# Patient Record
Sex: Female | Born: 1945
Health system: Southern US, Community
[De-identification: ages and names within clinical notes are randomized; demographics above are authoritative.]

## PROBLEM LIST (undated history)

## (undated) DIAGNOSIS — I10 Essential (primary) hypertension: Secondary | ICD-10-CM

## (undated) DIAGNOSIS — H409 Unspecified glaucoma: Secondary | ICD-10-CM

## (undated) DIAGNOSIS — L509 Urticaria, unspecified: Secondary | ICD-10-CM

## (undated) DIAGNOSIS — J45909 Unspecified asthma, uncomplicated: Secondary | ICD-10-CM

## (undated) DIAGNOSIS — L309 Dermatitis, unspecified: Secondary | ICD-10-CM

## (undated) DIAGNOSIS — E785 Hyperlipidemia, unspecified: Secondary | ICD-10-CM

## (undated) DIAGNOSIS — T783XXA Angioneurotic edema, initial encounter: Secondary | ICD-10-CM

## (undated) HISTORY — DX: Unspecified asthma, uncomplicated: J45.909

## (undated) HISTORY — DX: Hyperlipidemia, unspecified: E78.5

## (undated) HISTORY — DX: Angioneurotic edema, initial encounter: T78.3XXA

## (undated) HISTORY — DX: Urticaria, unspecified: L50.9

## (undated) HISTORY — DX: Unspecified glaucoma: H40.9

## (undated) HISTORY — DX: Essential (primary) hypertension: I10

## (undated) HISTORY — DX: Dermatitis, unspecified: L30.9

## (undated) HISTORY — PX: TONSILLECTOMY: SUR1361

---
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
## 2012-10-18 HISTORY — PX: COLONOSCOPY: SHX174

## 2012-11-10 ENCOUNTER — Encounter: Payer: Self-pay | Admitting: Gastroenterology

## 2013-04-03 DIAGNOSIS — H409 Unspecified glaucoma: Secondary | ICD-10-CM | POA: Diagnosis not present

## 2013-04-04 DIAGNOSIS — H538 Other visual disturbances: Secondary | ICD-10-CM | POA: Diagnosis not present

## 2013-04-04 DIAGNOSIS — K219 Gastro-esophageal reflux disease without esophagitis: Secondary | ICD-10-CM | POA: Diagnosis not present

## 2013-04-04 DIAGNOSIS — Z79899 Other long term (current) drug therapy: Secondary | ICD-10-CM | POA: Diagnosis not present

## 2013-04-04 DIAGNOSIS — I10 Essential (primary) hypertension: Secondary | ICD-10-CM | POA: Diagnosis not present

## 2013-04-04 DIAGNOSIS — R51 Headache: Secondary | ICD-10-CM | POA: Diagnosis not present

## 2013-04-04 DIAGNOSIS — R5381 Other malaise: Secondary | ICD-10-CM | POA: Diagnosis not present

## 2013-04-04 DIAGNOSIS — E7800 Pure hypercholesterolemia, unspecified: Secondary | ICD-10-CM | POA: Diagnosis not present

## 2013-04-04 DIAGNOSIS — R0602 Shortness of breath: Secondary | ICD-10-CM | POA: Diagnosis not present

## 2013-04-04 DIAGNOSIS — G459 Transient cerebral ischemic attack, unspecified: Secondary | ICD-10-CM | POA: Diagnosis not present

## 2013-04-04 DIAGNOSIS — R079 Chest pain, unspecified: Secondary | ICD-10-CM | POA: Diagnosis not present

## 2013-04-06 DIAGNOSIS — M069 Rheumatoid arthritis, unspecified: Secondary | ICD-10-CM | POA: Diagnosis not present

## 2013-04-06 DIAGNOSIS — I10 Essential (primary) hypertension: Secondary | ICD-10-CM | POA: Diagnosis not present

## 2013-05-04 DIAGNOSIS — I10 Essential (primary) hypertension: Secondary | ICD-10-CM | POA: Diagnosis not present

## 2013-05-04 DIAGNOSIS — H409 Unspecified glaucoma: Secondary | ICD-10-CM | POA: Diagnosis not present

## 2013-08-04 DIAGNOSIS — I10 Essential (primary) hypertension: Secondary | ICD-10-CM | POA: Diagnosis not present

## 2013-08-04 DIAGNOSIS — E7800 Pure hypercholesterolemia, unspecified: Secondary | ICD-10-CM | POA: Diagnosis not present

## 2013-08-04 DIAGNOSIS — M159 Polyosteoarthritis, unspecified: Secondary | ICD-10-CM | POA: Diagnosis not present

## 2013-08-04 DIAGNOSIS — Z79899 Other long term (current) drug therapy: Secondary | ICD-10-CM | POA: Diagnosis not present

## 2013-08-04 DIAGNOSIS — J019 Acute sinusitis, unspecified: Secondary | ICD-10-CM | POA: Diagnosis not present

## 2013-08-11 DIAGNOSIS — G43109 Migraine with aura, not intractable, without status migrainosus: Secondary | ICD-10-CM | POA: Diagnosis not present

## 2013-08-22 DIAGNOSIS — M4310 Spondylolisthesis, site unspecified: Secondary | ICD-10-CM | POA: Diagnosis not present

## 2013-08-24 DIAGNOSIS — M999 Biomechanical lesion, unspecified: Secondary | ICD-10-CM | POA: Diagnosis not present

## 2013-08-24 DIAGNOSIS — M9981 Other biomechanical lesions of cervical region: Secondary | ICD-10-CM | POA: Diagnosis not present

## 2013-08-24 DIAGNOSIS — M5137 Other intervertebral disc degeneration, lumbosacral region: Secondary | ICD-10-CM | POA: Diagnosis not present

## 2013-08-24 DIAGNOSIS — IMO0002 Reserved for concepts with insufficient information to code with codable children: Secondary | ICD-10-CM | POA: Diagnosis not present

## 2013-08-25 DIAGNOSIS — Z1231 Encounter for screening mammogram for malignant neoplasm of breast: Secondary | ICD-10-CM | POA: Diagnosis not present

## 2013-08-25 DIAGNOSIS — R928 Other abnormal and inconclusive findings on diagnostic imaging of breast: Secondary | ICD-10-CM | POA: Diagnosis not present

## 2013-08-28 DIAGNOSIS — M999 Biomechanical lesion, unspecified: Secondary | ICD-10-CM | POA: Diagnosis not present

## 2013-08-28 DIAGNOSIS — IMO0002 Reserved for concepts with insufficient information to code with codable children: Secondary | ICD-10-CM | POA: Diagnosis not present

## 2013-08-28 DIAGNOSIS — M5137 Other intervertebral disc degeneration, lumbosacral region: Secondary | ICD-10-CM | POA: Diagnosis not present

## 2013-08-28 DIAGNOSIS — M9981 Other biomechanical lesions of cervical region: Secondary | ICD-10-CM | POA: Diagnosis not present

## 2013-09-01 DIAGNOSIS — R928 Other abnormal and inconclusive findings on diagnostic imaging of breast: Secondary | ICD-10-CM | POA: Diagnosis not present

## 2013-09-01 DIAGNOSIS — N63 Unspecified lump in unspecified breast: Secondary | ICD-10-CM | POA: Diagnosis not present

## 2013-09-04 DIAGNOSIS — IMO0002 Reserved for concepts with insufficient information to code with codable children: Secondary | ICD-10-CM | POA: Diagnosis not present

## 2013-09-04 DIAGNOSIS — M9981 Other biomechanical lesions of cervical region: Secondary | ICD-10-CM | POA: Diagnosis not present

## 2013-09-04 DIAGNOSIS — M5137 Other intervertebral disc degeneration, lumbosacral region: Secondary | ICD-10-CM | POA: Diagnosis not present

## 2013-09-04 DIAGNOSIS — M999 Biomechanical lesion, unspecified: Secondary | ICD-10-CM | POA: Diagnosis not present

## 2013-09-06 DIAGNOSIS — M9981 Other biomechanical lesions of cervical region: Secondary | ICD-10-CM | POA: Diagnosis not present

## 2013-09-06 DIAGNOSIS — M999 Biomechanical lesion, unspecified: Secondary | ICD-10-CM | POA: Diagnosis not present

## 2013-09-06 DIAGNOSIS — IMO0002 Reserved for concepts with insufficient information to code with codable children: Secondary | ICD-10-CM | POA: Diagnosis not present

## 2013-09-06 DIAGNOSIS — M5137 Other intervertebral disc degeneration, lumbosacral region: Secondary | ICD-10-CM | POA: Diagnosis not present

## 2013-09-11 DIAGNOSIS — M5137 Other intervertebral disc degeneration, lumbosacral region: Secondary | ICD-10-CM | POA: Diagnosis not present

## 2013-09-11 DIAGNOSIS — IMO0002 Reserved for concepts with insufficient information to code with codable children: Secondary | ICD-10-CM | POA: Diagnosis not present

## 2013-09-11 DIAGNOSIS — M999 Biomechanical lesion, unspecified: Secondary | ICD-10-CM | POA: Diagnosis not present

## 2013-09-11 DIAGNOSIS — M9981 Other biomechanical lesions of cervical region: Secondary | ICD-10-CM | POA: Diagnosis not present

## 2013-09-13 DIAGNOSIS — M5137 Other intervertebral disc degeneration, lumbosacral region: Secondary | ICD-10-CM | POA: Diagnosis not present

## 2013-09-13 DIAGNOSIS — M999 Biomechanical lesion, unspecified: Secondary | ICD-10-CM | POA: Diagnosis not present

## 2013-09-13 DIAGNOSIS — M9981 Other biomechanical lesions of cervical region: Secondary | ICD-10-CM | POA: Diagnosis not present

## 2013-09-13 DIAGNOSIS — IMO0002 Reserved for concepts with insufficient information to code with codable children: Secondary | ICD-10-CM | POA: Diagnosis not present

## 2013-09-18 DIAGNOSIS — M9981 Other biomechanical lesions of cervical region: Secondary | ICD-10-CM | POA: Diagnosis not present

## 2013-09-18 DIAGNOSIS — M999 Biomechanical lesion, unspecified: Secondary | ICD-10-CM | POA: Diagnosis not present

## 2013-09-18 DIAGNOSIS — IMO0002 Reserved for concepts with insufficient information to code with codable children: Secondary | ICD-10-CM | POA: Diagnosis not present

## 2013-09-18 DIAGNOSIS — M5137 Other intervertebral disc degeneration, lumbosacral region: Secondary | ICD-10-CM | POA: Diagnosis not present

## 2013-09-21 DIAGNOSIS — M5137 Other intervertebral disc degeneration, lumbosacral region: Secondary | ICD-10-CM | POA: Diagnosis not present

## 2013-09-21 DIAGNOSIS — M9981 Other biomechanical lesions of cervical region: Secondary | ICD-10-CM | POA: Diagnosis not present

## 2013-09-21 DIAGNOSIS — IMO0002 Reserved for concepts with insufficient information to code with codable children: Secondary | ICD-10-CM | POA: Diagnosis not present

## 2013-09-21 DIAGNOSIS — M999 Biomechanical lesion, unspecified: Secondary | ICD-10-CM | POA: Diagnosis not present

## 2013-10-02 DIAGNOSIS — M999 Biomechanical lesion, unspecified: Secondary | ICD-10-CM | POA: Diagnosis not present

## 2013-10-02 DIAGNOSIS — IMO0002 Reserved for concepts with insufficient information to code with codable children: Secondary | ICD-10-CM | POA: Diagnosis not present

## 2013-10-02 DIAGNOSIS — M9981 Other biomechanical lesions of cervical region: Secondary | ICD-10-CM | POA: Diagnosis not present

## 2013-10-02 DIAGNOSIS — M5137 Other intervertebral disc degeneration, lumbosacral region: Secondary | ICD-10-CM | POA: Diagnosis not present

## 2013-10-11 DIAGNOSIS — I10 Essential (primary) hypertension: Secondary | ICD-10-CM | POA: Diagnosis not present

## 2013-10-11 DIAGNOSIS — R920 Mammographic microcalcification found on diagnostic imaging of breast: Secondary | ICD-10-CM | POA: Diagnosis not present

## 2013-10-11 DIAGNOSIS — E785 Hyperlipidemia, unspecified: Secondary | ICD-10-CM | POA: Diagnosis not present

## 2013-11-08 DIAGNOSIS — M7700 Medial epicondylitis, unspecified elbow: Secondary | ICD-10-CM | POA: Diagnosis not present

## 2013-11-08 DIAGNOSIS — I119 Hypertensive heart disease without heart failure: Secondary | ICD-10-CM | POA: Diagnosis not present

## 2013-11-08 DIAGNOSIS — R920 Mammographic microcalcification found on diagnostic imaging of breast: Secondary | ICD-10-CM | POA: Diagnosis not present

## 2013-11-13 DIAGNOSIS — I10 Essential (primary) hypertension: Secondary | ICD-10-CM | POA: Diagnosis not present

## 2013-11-13 DIAGNOSIS — I119 Hypertensive heart disease without heart failure: Secondary | ICD-10-CM | POA: Diagnosis not present

## 2013-11-13 DIAGNOSIS — E785 Hyperlipidemia, unspecified: Secondary | ICD-10-CM | POA: Diagnosis not present

## 2013-11-13 DIAGNOSIS — R920 Mammographic microcalcification found on diagnostic imaging of breast: Secondary | ICD-10-CM | POA: Diagnosis not present

## 2013-11-13 DIAGNOSIS — IMO0001 Reserved for inherently not codable concepts without codable children: Secondary | ICD-10-CM | POA: Diagnosis not present

## 2013-11-13 DIAGNOSIS — Z8742 Personal history of other diseases of the female genital tract: Secondary | ICD-10-CM | POA: Diagnosis not present

## 2013-11-13 DIAGNOSIS — H409 Unspecified glaucoma: Secondary | ICD-10-CM | POA: Diagnosis not present

## 2013-11-13 DIAGNOSIS — M069 Rheumatoid arthritis, unspecified: Secondary | ICD-10-CM | POA: Diagnosis not present

## 2013-11-16 DIAGNOSIS — K648 Other hemorrhoids: Secondary | ICD-10-CM | POA: Diagnosis not present

## 2013-11-27 DIAGNOSIS — R55 Syncope and collapse: Secondary | ICD-10-CM | POA: Diagnosis not present

## 2013-11-27 DIAGNOSIS — R0989 Other specified symptoms and signs involving the circulatory and respiratory systems: Secondary | ICD-10-CM | POA: Diagnosis not present

## 2013-12-04 DIAGNOSIS — H40009 Preglaucoma, unspecified, unspecified eye: Secondary | ICD-10-CM | POA: Diagnosis not present

## 2013-12-05 DIAGNOSIS — R920 Mammographic microcalcification found on diagnostic imaging of breast: Secondary | ICD-10-CM | POA: Diagnosis not present

## 2013-12-05 DIAGNOSIS — N6009 Solitary cyst of unspecified breast: Secondary | ICD-10-CM | POA: Diagnosis not present

## 2013-12-19 DIAGNOSIS — H4010X0 Unspecified open-angle glaucoma, stage unspecified: Secondary | ICD-10-CM | POA: Diagnosis not present

## 2014-03-20 DIAGNOSIS — M999 Biomechanical lesion, unspecified: Secondary | ICD-10-CM | POA: Diagnosis not present

## 2014-03-20 DIAGNOSIS — IMO0002 Reserved for concepts with insufficient information to code with codable children: Secondary | ICD-10-CM | POA: Diagnosis not present

## 2014-03-20 DIAGNOSIS — M9981 Other biomechanical lesions of cervical region: Secondary | ICD-10-CM | POA: Diagnosis not present

## 2014-03-20 DIAGNOSIS — M5137 Other intervertebral disc degeneration, lumbosacral region: Secondary | ICD-10-CM | POA: Diagnosis not present

## 2014-03-22 DIAGNOSIS — M5137 Other intervertebral disc degeneration, lumbosacral region: Secondary | ICD-10-CM | POA: Diagnosis not present

## 2014-03-22 DIAGNOSIS — M999 Biomechanical lesion, unspecified: Secondary | ICD-10-CM | POA: Diagnosis not present

## 2014-03-22 DIAGNOSIS — IMO0002 Reserved for concepts with insufficient information to code with codable children: Secondary | ICD-10-CM | POA: Diagnosis not present

## 2014-03-22 DIAGNOSIS — M9981 Other biomechanical lesions of cervical region: Secondary | ICD-10-CM | POA: Diagnosis not present

## 2014-05-17 DIAGNOSIS — M542 Cervicalgia: Secondary | ICD-10-CM | POA: Diagnosis not present

## 2014-05-17 DIAGNOSIS — M5136 Other intervertebral disc degeneration, lumbar region: Secondary | ICD-10-CM | POA: Diagnosis not present

## 2014-05-17 DIAGNOSIS — M5442 Lumbago with sciatica, left side: Secondary | ICD-10-CM | POA: Diagnosis not present

## 2014-05-17 DIAGNOSIS — M9901 Segmental and somatic dysfunction of cervical region: Secondary | ICD-10-CM | POA: Diagnosis not present

## 2014-05-17 DIAGNOSIS — M9903 Segmental and somatic dysfunction of lumbar region: Secondary | ICD-10-CM | POA: Diagnosis not present

## 2014-05-17 DIAGNOSIS — M9902 Segmental and somatic dysfunction of thoracic region: Secondary | ICD-10-CM | POA: Diagnosis not present

## 2014-08-07 DIAGNOSIS — R921 Mammographic calcification found on diagnostic imaging of breast: Secondary | ICD-10-CM | POA: Diagnosis not present

## 2014-08-07 DIAGNOSIS — N6001 Solitary cyst of right breast: Secondary | ICD-10-CM | POA: Diagnosis not present

## 2014-08-07 DIAGNOSIS — Z09 Encounter for follow-up examination after completed treatment for conditions other than malignant neoplasm: Secondary | ICD-10-CM | POA: Diagnosis not present

## 2014-08-07 DIAGNOSIS — Z803 Family history of malignant neoplasm of breast: Secondary | ICD-10-CM | POA: Diagnosis not present

## 2014-09-05 DIAGNOSIS — I119 Hypertensive heart disease without heart failure: Secondary | ICD-10-CM | POA: Diagnosis not present

## 2014-09-05 DIAGNOSIS — G4700 Insomnia, unspecified: Secondary | ICD-10-CM | POA: Diagnosis not present

## 2014-09-05 DIAGNOSIS — M0609 Rheumatoid arthritis without rheumatoid factor, multiple sites: Secondary | ICD-10-CM | POA: Diagnosis not present

## 2014-09-05 DIAGNOSIS — J189 Pneumonia, unspecified organism: Secondary | ICD-10-CM | POA: Diagnosis not present

## 2014-09-13 DIAGNOSIS — J189 Pneumonia, unspecified organism: Secondary | ICD-10-CM | POA: Diagnosis not present

## 2014-09-13 DIAGNOSIS — M62838 Other muscle spasm: Secondary | ICD-10-CM | POA: Diagnosis not present

## 2014-11-15 DIAGNOSIS — M8580 Other specified disorders of bone density and structure, unspecified site: Secondary | ICD-10-CM | POA: Diagnosis not present

## 2014-11-15 DIAGNOSIS — Z79899 Other long term (current) drug therapy: Secondary | ICD-10-CM | POA: Diagnosis not present

## 2014-11-15 DIAGNOSIS — Z87898 Personal history of other specified conditions: Secondary | ICD-10-CM | POA: Diagnosis not present

## 2014-11-15 DIAGNOSIS — I10 Essential (primary) hypertension: Secondary | ICD-10-CM | POA: Diagnosis not present

## 2014-11-15 DIAGNOSIS — Z0000 Encounter for general adult medical examination without abnormal findings: Secondary | ICD-10-CM | POA: Diagnosis not present

## 2014-11-15 DIAGNOSIS — M159 Polyosteoarthritis, unspecified: Secondary | ICD-10-CM | POA: Diagnosis not present

## 2014-11-15 DIAGNOSIS — E780 Pure hypercholesterolemia: Secondary | ICD-10-CM | POA: Diagnosis not present

## 2015-01-07 DIAGNOSIS — H409 Unspecified glaucoma: Secondary | ICD-10-CM | POA: Diagnosis not present

## 2015-01-14 DIAGNOSIS — R0789 Other chest pain: Secondary | ICD-10-CM | POA: Diagnosis not present

## 2015-01-14 DIAGNOSIS — L239 Allergic contact dermatitis, unspecified cause: Secondary | ICD-10-CM | POA: Diagnosis not present

## 2015-01-14 DIAGNOSIS — L500 Allergic urticaria: Secondary | ICD-10-CM | POA: Diagnosis not present

## 2015-01-23 DIAGNOSIS — M5442 Lumbago with sciatica, left side: Secondary | ICD-10-CM | POA: Diagnosis not present

## 2015-01-23 DIAGNOSIS — M9902 Segmental and somatic dysfunction of thoracic region: Secondary | ICD-10-CM | POA: Diagnosis not present

## 2015-01-23 DIAGNOSIS — M5136 Other intervertebral disc degeneration, lumbar region: Secondary | ICD-10-CM | POA: Diagnosis not present

## 2015-01-23 DIAGNOSIS — M9903 Segmental and somatic dysfunction of lumbar region: Secondary | ICD-10-CM | POA: Diagnosis not present

## 2015-01-23 DIAGNOSIS — M9901 Segmental and somatic dysfunction of cervical region: Secondary | ICD-10-CM | POA: Diagnosis not present

## 2015-01-23 DIAGNOSIS — M542 Cervicalgia: Secondary | ICD-10-CM | POA: Diagnosis not present

## 2015-01-24 DIAGNOSIS — M9902 Segmental and somatic dysfunction of thoracic region: Secondary | ICD-10-CM | POA: Diagnosis not present

## 2015-01-24 DIAGNOSIS — M5442 Lumbago with sciatica, left side: Secondary | ICD-10-CM | POA: Diagnosis not present

## 2015-01-24 DIAGNOSIS — M9903 Segmental and somatic dysfunction of lumbar region: Secondary | ICD-10-CM | POA: Diagnosis not present

## 2015-01-24 DIAGNOSIS — M542 Cervicalgia: Secondary | ICD-10-CM | POA: Diagnosis not present

## 2015-01-24 DIAGNOSIS — M9901 Segmental and somatic dysfunction of cervical region: Secondary | ICD-10-CM | POA: Diagnosis not present

## 2015-01-24 DIAGNOSIS — M5136 Other intervertebral disc degeneration, lumbar region: Secondary | ICD-10-CM | POA: Diagnosis not present

## 2015-01-28 DIAGNOSIS — H409 Unspecified glaucoma: Secondary | ICD-10-CM | POA: Diagnosis not present

## 2015-01-30 DIAGNOSIS — M542 Cervicalgia: Secondary | ICD-10-CM | POA: Diagnosis not present

## 2015-01-30 DIAGNOSIS — M5136 Other intervertebral disc degeneration, lumbar region: Secondary | ICD-10-CM | POA: Diagnosis not present

## 2015-01-30 DIAGNOSIS — M5442 Lumbago with sciatica, left side: Secondary | ICD-10-CM | POA: Diagnosis not present

## 2015-01-30 DIAGNOSIS — M9901 Segmental and somatic dysfunction of cervical region: Secondary | ICD-10-CM | POA: Diagnosis not present

## 2015-01-30 DIAGNOSIS — M9903 Segmental and somatic dysfunction of lumbar region: Secondary | ICD-10-CM | POA: Diagnosis not present

## 2015-01-30 DIAGNOSIS — M9902 Segmental and somatic dysfunction of thoracic region: Secondary | ICD-10-CM | POA: Diagnosis not present

## 2015-02-18 DIAGNOSIS — H409 Unspecified glaucoma: Secondary | ICD-10-CM | POA: Diagnosis not present

## 2015-04-17 DIAGNOSIS — M9903 Segmental and somatic dysfunction of lumbar region: Secondary | ICD-10-CM | POA: Diagnosis not present

## 2015-04-17 DIAGNOSIS — M9901 Segmental and somatic dysfunction of cervical region: Secondary | ICD-10-CM | POA: Diagnosis not present

## 2015-04-17 DIAGNOSIS — M5442 Lumbago with sciatica, left side: Secondary | ICD-10-CM | POA: Diagnosis not present

## 2015-04-17 DIAGNOSIS — M5136 Other intervertebral disc degeneration, lumbar region: Secondary | ICD-10-CM | POA: Diagnosis not present

## 2015-04-17 DIAGNOSIS — M9902 Segmental and somatic dysfunction of thoracic region: Secondary | ICD-10-CM | POA: Diagnosis not present

## 2015-04-17 DIAGNOSIS — M542 Cervicalgia: Secondary | ICD-10-CM | POA: Diagnosis not present

## 2015-04-18 DIAGNOSIS — M542 Cervicalgia: Secondary | ICD-10-CM | POA: Diagnosis not present

## 2015-04-18 DIAGNOSIS — M9902 Segmental and somatic dysfunction of thoracic region: Secondary | ICD-10-CM | POA: Diagnosis not present

## 2015-04-18 DIAGNOSIS — M5442 Lumbago with sciatica, left side: Secondary | ICD-10-CM | POA: Diagnosis not present

## 2015-04-18 DIAGNOSIS — M5136 Other intervertebral disc degeneration, lumbar region: Secondary | ICD-10-CM | POA: Diagnosis not present

## 2015-04-18 DIAGNOSIS — M9901 Segmental and somatic dysfunction of cervical region: Secondary | ICD-10-CM | POA: Diagnosis not present

## 2015-04-18 DIAGNOSIS — M9903 Segmental and somatic dysfunction of lumbar region: Secondary | ICD-10-CM | POA: Diagnosis not present

## 2015-04-29 DIAGNOSIS — M159 Polyosteoarthritis, unspecified: Secondary | ICD-10-CM | POA: Diagnosis not present

## 2015-04-29 DIAGNOSIS — I119 Hypertensive heart disease without heart failure: Secondary | ICD-10-CM | POA: Diagnosis not present

## 2015-04-29 DIAGNOSIS — J019 Acute sinusitis, unspecified: Secondary | ICD-10-CM | POA: Diagnosis not present

## 2015-05-21 DIAGNOSIS — H409 Unspecified glaucoma: Secondary | ICD-10-CM | POA: Diagnosis not present

## 2015-07-02 DIAGNOSIS — M797 Fibromyalgia: Secondary | ICD-10-CM | POA: Diagnosis not present

## 2015-07-02 DIAGNOSIS — M0609 Rheumatoid arthritis without rheumatoid factor, multiple sites: Secondary | ICD-10-CM | POA: Diagnosis not present

## 2015-08-08 DIAGNOSIS — D229 Melanocytic nevi, unspecified: Secondary | ICD-10-CM | POA: Diagnosis not present

## 2015-08-09 DIAGNOSIS — L821 Other seborrheic keratosis: Secondary | ICD-10-CM | POA: Diagnosis not present

## 2015-08-09 DIAGNOSIS — L918 Other hypertrophic disorders of the skin: Secondary | ICD-10-CM | POA: Diagnosis not present

## 2015-08-09 DIAGNOSIS — D224 Melanocytic nevi of scalp and neck: Secondary | ICD-10-CM | POA: Diagnosis not present

## 2015-08-09 DIAGNOSIS — L578 Other skin changes due to chronic exposure to nonionizing radiation: Secondary | ICD-10-CM | POA: Diagnosis not present

## 2015-08-12 DIAGNOSIS — Z803 Family history of malignant neoplasm of breast: Secondary | ICD-10-CM | POA: Diagnosis not present

## 2015-08-12 DIAGNOSIS — Z1231 Encounter for screening mammogram for malignant neoplasm of breast: Secondary | ICD-10-CM | POA: Diagnosis not present

## 2015-08-28 DIAGNOSIS — H25813 Combined forms of age-related cataract, bilateral: Secondary | ICD-10-CM | POA: Diagnosis not present

## 2015-08-28 DIAGNOSIS — H40003 Preglaucoma, unspecified, bilateral: Secondary | ICD-10-CM | POA: Diagnosis not present

## 2015-10-02 DIAGNOSIS — J019 Acute sinusitis, unspecified: Secondary | ICD-10-CM | POA: Diagnosis not present

## 2015-10-17 DIAGNOSIS — H40003 Preglaucoma, unspecified, bilateral: Secondary | ICD-10-CM | POA: Diagnosis not present

## 2015-11-12 DIAGNOSIS — L259 Unspecified contact dermatitis, unspecified cause: Secondary | ICD-10-CM | POA: Diagnosis not present

## 2015-12-10 DIAGNOSIS — K644 Residual hemorrhoidal skin tags: Secondary | ICD-10-CM | POA: Diagnosis not present

## 2015-12-10 DIAGNOSIS — K648 Other hemorrhoids: Secondary | ICD-10-CM | POA: Diagnosis not present

## 2015-12-19 DIAGNOSIS — J918 Pleural effusion in other conditions classified elsewhere: Secondary | ICD-10-CM | POA: Diagnosis not present

## 2015-12-19 DIAGNOSIS — J069 Acute upper respiratory infection, unspecified: Secondary | ICD-10-CM | POA: Diagnosis not present

## 2016-02-25 DIAGNOSIS — R1084 Generalized abdominal pain: Secondary | ICD-10-CM | POA: Diagnosis not present

## 2016-02-25 DIAGNOSIS — I10 Essential (primary) hypertension: Secondary | ICD-10-CM | POA: Diagnosis not present

## 2016-02-25 DIAGNOSIS — M797 Fibromyalgia: Secondary | ICD-10-CM | POA: Diagnosis not present

## 2016-03-06 DIAGNOSIS — Z13818 Encounter for screening for other digestive system disorders: Secondary | ICD-10-CM | POA: Diagnosis not present

## 2016-03-06 DIAGNOSIS — R7301 Impaired fasting glucose: Secondary | ICD-10-CM | POA: Diagnosis not present

## 2016-03-06 DIAGNOSIS — E559 Vitamin D deficiency, unspecified: Secondary | ICD-10-CM | POA: Diagnosis not present

## 2016-03-06 DIAGNOSIS — E782 Mixed hyperlipidemia: Secondary | ICD-10-CM | POA: Diagnosis not present

## 2016-03-10 DIAGNOSIS — E785 Hyperlipidemia, unspecified: Secondary | ICD-10-CM | POA: Diagnosis not present

## 2016-03-10 DIAGNOSIS — Z0000 Encounter for general adult medical examination without abnormal findings: Secondary | ICD-10-CM | POA: Diagnosis not present

## 2016-03-10 DIAGNOSIS — I10 Essential (primary) hypertension: Secondary | ICD-10-CM | POA: Diagnosis not present

## 2016-03-10 DIAGNOSIS — M797 Fibromyalgia: Secondary | ICD-10-CM | POA: Diagnosis not present

## 2016-04-14 DIAGNOSIS — H40003 Preglaucoma, unspecified, bilateral: Secondary | ICD-10-CM | POA: Diagnosis not present

## 2016-06-03 DIAGNOSIS — H04123 Dry eye syndrome of bilateral lacrimal glands: Secondary | ICD-10-CM | POA: Diagnosis not present

## 2016-06-03 DIAGNOSIS — H40003 Preglaucoma, unspecified, bilateral: Secondary | ICD-10-CM | POA: Diagnosis not present

## 2016-06-08 DIAGNOSIS — I10 Essential (primary) hypertension: Secondary | ICD-10-CM | POA: Diagnosis not present

## 2016-06-08 DIAGNOSIS — J019 Acute sinusitis, unspecified: Secondary | ICD-10-CM | POA: Diagnosis not present

## 2016-06-08 DIAGNOSIS — Z6834 Body mass index (BMI) 34.0-34.9, adult: Secondary | ICD-10-CM | POA: Diagnosis not present

## 2016-06-08 DIAGNOSIS — T7840XD Allergy, unspecified, subsequent encounter: Secondary | ICD-10-CM | POA: Diagnosis not present

## 2016-06-08 DIAGNOSIS — M549 Dorsalgia, unspecified: Secondary | ICD-10-CM | POA: Diagnosis not present

## 2016-07-21 DIAGNOSIS — R600 Localized edema: Secondary | ICD-10-CM | POA: Diagnosis not present

## 2016-07-21 DIAGNOSIS — Z6834 Body mass index (BMI) 34.0-34.9, adult: Secondary | ICD-10-CM | POA: Diagnosis not present

## 2016-07-21 DIAGNOSIS — T7840XD Allergy, unspecified, subsequent encounter: Secondary | ICD-10-CM | POA: Diagnosis not present

## 2016-07-21 DIAGNOSIS — I10 Essential (primary) hypertension: Secondary | ICD-10-CM | POA: Diagnosis not present

## 2016-07-24 DIAGNOSIS — H40003 Preglaucoma, unspecified, bilateral: Secondary | ICD-10-CM | POA: Diagnosis not present

## 2016-08-11 DIAGNOSIS — R600 Localized edema: Secondary | ICD-10-CM | POA: Diagnosis not present

## 2016-08-11 DIAGNOSIS — M797 Fibromyalgia: Secondary | ICD-10-CM | POA: Diagnosis not present

## 2016-08-11 DIAGNOSIS — T7840XD Allergy, unspecified, subsequent encounter: Secondary | ICD-10-CM | POA: Diagnosis not present

## 2016-08-11 DIAGNOSIS — I10 Essential (primary) hypertension: Secondary | ICD-10-CM | POA: Diagnosis not present

## 2016-08-18 ENCOUNTER — Encounter: Payer: Self-pay | Admitting: Allergy and Immunology

## 2016-08-18 ENCOUNTER — Ambulatory Visit (INDEPENDENT_AMBULATORY_CARE_PROVIDER_SITE_OTHER): Payer: Medicare Other | Admitting: Allergy and Immunology

## 2016-08-18 VITALS — BP 136/78 | HR 76 | Temp 97.8°F | Resp 16 | Ht 67.75 in | Wt 218.5 lb

## 2016-08-18 DIAGNOSIS — Z91018 Allergy to other foods: Secondary | ICD-10-CM | POA: Diagnosis not present

## 2016-08-18 DIAGNOSIS — H1010 Acute atopic conjunctivitis, unspecified eye: Secondary | ICD-10-CM | POA: Insufficient documentation

## 2016-08-18 DIAGNOSIS — H1013 Acute atopic conjunctivitis, bilateral: Secondary | ICD-10-CM | POA: Diagnosis not present

## 2016-08-18 DIAGNOSIS — Z8709 Personal history of other diseases of the respiratory system: Secondary | ICD-10-CM

## 2016-08-18 DIAGNOSIS — J3089 Other allergic rhinitis: Secondary | ICD-10-CM | POA: Insufficient documentation

## 2016-08-18 MED ORDER — EPINEPHRINE 0.3 MG/0.3ML IJ SOAJ: INTRAMUSCULAR | 1 refills | Status: DC

## 2016-08-18 MED ORDER — OLOPATADINE HCL 0.2 % OP SOLN: 1.0000 [drp] | Freq: Every day | OPHTHALMIC | 5 refills | Status: DC

## 2016-08-18 MED ORDER — AZELASTINE HCL 0.1 % NA SOLN: NASAL | 3 refills | Status: DC

## 2016-08-18 NOTE — Addendum Note (Signed)
Addended by: Golda Acre C on: 08/18/2016 01:34 PM   Modules accepted: Orders

## 2016-08-18 NOTE — Assessment & Plan Note (Signed)
Quiescent.  Unless lower respiratory symptoms recur, we will not treat or evaluate further. 

## 2016-08-18 NOTE — Patient Instructions (Addendum)
Perennial and seasonal allergic rhinitis  Aeroallergen avoidance measures have been discussed and provided in written form.  A prescription has been provided for azelastine nasal spray, 1-2 sprays per nostril 2 times daily as needed. Proper nasal spray technique has been discussed and demonstrated.   Nasal saline lavage (NeilMed) has been recommended prior to medicated nasal sprays and as needed along with instructions for proper administration.  For thick post nasal drainage, add guaifenesin 1200 mg (Mucinex Maximum Strength)  twice daily as needed with adequate hydration as discussed.  The risks and benefits of aeroallergen immunotherapy have been discussed. The patient is motivated to initiate immunotherapy if insurance coverage is favorable. She will let us know how she would like to proceed.  Allergic conjunctivitis  Treatment plan as outlined above for allergic rhinitis.  A prescription has been provided for Pataday, one drop per eye daily as needed.  History of food allergy The patient's history suggests peanut allergy.  Her food allergen skin testing was negative to peanut but borderline positive to hazelnut.  We will attempt to clarify the etiology with blood work.  A lab order form has been provided for serum specific IgE against peanut, peanut component, and tree nut panel.  Until food allergy has been definitively ruled out, she will continue to avoid peanut as well as tree nuts and have access to epinephrine autoinjectors.  A prescription has been provided for epinephrine 0.3 mg autoinjector 2 pack along with instructions for its proper administration.  History of asthma Quiescent.  Unless lower respiratory symptoms recur, we will not treat or evaluate further.   When lab results have returned the patient will be called with further recommendations and follow up instructions.  Reducing Pollen Exposure  The American Academy of Allergy, Asthma and Immunology suggests  the following steps to reduce your exposure to pollen during allergy seasons.    1. Do not hang sheets or clothing out to dry; pollen may collect on these items. 2. Do not mow lawns or spend time around freshly cut grass; mowing stirs up pollen. 3. Keep windows closed at night.  Keep car windows closed while driving. 4. Minimize morning activities outdoors, a time when pollen counts are usually at their highest. 5. Stay indoors as much as possible when pollen counts or humidity is high and on windy days when pollen tends to remain in the air longer. 6. Use air conditioning when possible.  Many air conditioners have filters that trap the pollen spores. 7. Use a HEPA room air filter to remove pollen form the indoor air you breathe.   Control of House Dust Mite Allergen  House dust mites play a major role in allergic asthma and rhinitis.  They occur in environments with high humidity wherever human skin, the food for dust mites is found. High levels have been detected in dust obtained from mattresses, pillows, carpets, upholstered furniture, bed covers, clothes and soft toys.  The principal allergen of the house dust mite is found in its feces.  A gram of dust may contain 1,000 mites and 250,000 fecal particles.  Mite antigen is easily measured in the air during house cleaning activities.    1. Encase mattresses, including the box spring, and pillow, in an air tight cover.  Seal the zipper end of the encased mattresses with wide adhesive tape. 2. Wash the bedding in water of 130 degrees Farenheit weekly.  Avoid cotton comforters/quilts and flannel bedding: the most ideal bed covering is the dacron comforter. 3. Remove  all upholstered furniture from the bedroom. 4. Remove carpets, carpet padding, rugs, and non-washable window drapes from the bedroom.  Wash drapes weekly or use plastic window coverings. 5. Remove all non-washable stuffed toys from the bedroom.  Wash stuffed toys weekly. 6. Have the  room cleaned frequently with a vacuum cleaner and a damp dust-mop.  The patient should not be in a room which is being cleaned and should wait 1 hour after cleaning before going into the room. 7. Close and seal all heating outlets in the bedroom.  Otherwise, the room will become filled with dust-laden air.  An electric heater can be used to heat the room. 8. Reduce indoor humidity to less than 50%.  Do not use a humidifier.  Control of Dog or Cat Allergen  Avoidance is the best way to manage a dog or cat allergy. If you have a dog or cat and are allergic to dog or cats, consider removing the dog or cat from the home. If you have a dog or cat but don't want to find it a new home, or if your family wants a pet even though someone in the household is allergic, here are some strategies that may help keep symptoms at bay:  1. Keep the pet out of your bedroom and restrict it to only a few rooms. Be advised that keeping the dog or cat in only one room will not limit the allergens to that room. 2. Don't pet, hug or kiss the dog or cat; if you do, wash your hands with soap and water. 3. High-efficiency particulate air (HEPA) cleaners run continuously in a bedroom or living room can reduce allergen levels over time. 4. Regular use of a high-efficiency vacuum cleaner or a central vacuum can reduce allergen levels. 5. Giving your dog or cat a bath at least once a week can reduce airborne allergen.

## 2016-08-18 NOTE — Assessment & Plan Note (Addendum)
   Treatment plan as outlined above for allergic rhinitis.  A prescription has been provided for Pataday, one drop per eye daily as needed. 

## 2016-08-18 NOTE — Assessment & Plan Note (Signed)
   Aeroallergen avoidance measures have been discussed and provided in written form.  A prescription has been provided for azelastine nasal spray, 1-2 sprays per nostril 2 times daily as needed. Proper nasal spray technique has been discussed and demonstrated.   Nasal saline lavage (NeilMed) has been recommended prior to medicated nasal sprays and as needed along with instructions for proper administration.  For thick post nasal drainage, add guaifenesin 1200 mg (Mucinex Maximum Strength)  twice daily as needed with adequate hydration as discussed.  The risks and benefits of aeroallergen immunotherapy have been discussed. The patient is motivated to initiate immunotherapy if insurance coverage is favorable. She will let us know how she would like to proceed.

## 2016-08-18 NOTE — Progress Notes (Addendum)
New Patient Note  RE: Stacey Atkinson MRN: [id number] DOB: 02-11-1946 Date of Office Visit: 08/18/2016  Referring provider: Celene Squibb, MD Primary care provider: Wende Neighbors, MD  Chief Complaint: Nasal Congestion; Sinus Problem; and Food Intolerance   History of present illness: Stacey Atkinson is a 71 y.o. female seen today in consultation requested by Allyn Kenner, MD.  She reports that over the past 10 years she has experienced persistent nasal congestion, rhinorrhea, sneezing, "very bad" postnasal drainage, nasal pruritus, ocular pruritus, and occasional sinus pressure.  These symptoms occur year around but tend to be triggered by exposure to pollens, cats, dogs, and strong aromas such as perfumes and chemicals.  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  She reports that she has for sinus infections requiring antibiotics per year on average.  She is also concerned about the possibility of food allergies.  Approximately 20 or 30 years ago she began to notice symptoms associated with the consumption of peanuts.  She states that she is able to eat peanuts without problems and last she consumes them the next day.  If she does consume peanuts 2 days in a row, typically within 30 minutes, she develops nasal congestion and possibly mild lip swelling and eyelid swelling.  She was diagnosed with asthma when she was in her 65s.  However, she states that after she began taking good care of her health and eating alfalfa sprouts, her asthma became quiescent and she has not had significant lower respiratory symptoms over the past 30 years.   Assessment and plan: Perennial and seasonal allergic rhinitis  Aeroallergen avoidance measures have been discussed and provided in written form.  A prescription has been provided for azelastine nasal spray, 1-2 sprays per nostril 2 times daily as needed. Proper nasal  spray technique has been discussed and demonstrated.   Nasal saline lavage (NeilMed) has been recommended prior to medicated nasal sprays and as needed along with instructions for proper administration.  For thick post nasal drainage, add guaifenesin 1200 mg (Mucinex Maximum Strength)  twice daily as needed with adequate hydration as discussed.  The risks and benefits of aeroallergen immunotherapy have been discussed. The patient is motivated to initiate immunotherapy if insurance coverage is favorable. She will let us know how she would like to proceed.  Allergic conjunctivitis  Treatment plan as outlined above for allergic rhinitis.  A prescription has been provided for Pataday, one drop per eye daily as needed.  History of food allergy The patient's history suggests peanut allergy.  Her food allergen skin testing was negative to peanut but borderline positive to hazelnut.  We will attempt to clarify the etiology with blood work.  A lab order form has been provided for serum specific IgE against peanut, peanut component, and tree nut panel.  Until food allergy has been definitively ruled out, she will continue to avoid peanut as well as tree nuts and have access to epinephrine autoinjectors.  A prescription has been provided for epinephrine 0.3 mg autoinjector 2 pack along with instructions for its proper administration.  History of asthma Quiescent.  Unless lower respiratory symptoms recur, we will not treat or evaluate further.   Meds ordered this encounter  Medications  . azelastine (ASTELIN) 0.1 % nasal spray    Sig: Use 1-2 sprays per nostril 2 times daily as needed    Dispense:  30 mL  Refill:  3  . EPINEPHrine 0.3 mg/0.3 mL IJ SOAJ injection    Sig: Use as directed for severe allergic reaction    Dispense:  2 Device    Refill:  1  . Olopatadine HCl (PATADAY) 0.2 % SOLN    Sig: Place 1 drop into both eyes daily.    Dispense:  1 Bottle    Refill:  5     Diagnostics: Spirometry:  Normal with an FEV1 of 86% predicted.  Please see scanned spirometry results for details. Epicutaneous testing: Positive to grass pollens, ragweed pollen, weed pollens, tree pollens, and cat hair. Intradermal testing: Positive to dog epithelia and dust mite antigen. Food allergen skin testing: Borderline positive to hazelnut.    Physical examination: Blood pressure 136/78, pulse 76, temperature 97.8 F (36.6 C), temperature source Oral, resp. rate 16, height 5' 7.75" (1.721 m), weight 218 lb 8 oz (99.1 kg).  General: Alert, interactive, in no acute distress. HEENT: TMs pearly gray, turbinates edematous without discharge, post-pharynx moderately erythematous. Neck: Supple without lymphadenopathy. Lungs: Clear to auscultation without wheezing, rhonchi or rales. CV: Normal S1, S2 without murmurs. Abdomen: Nondistended, nontender. Skin: Warm and dry, without lesions or rashes. Extremities:  No clubbing, cyanosis or edema. Neuro:   Grossly intact.  Review of systems:  Review of systems negative except as noted in HPI / PMHx or noted below: Review of Systems  Constitutional: Negative.   HENT: Negative.   Eyes: Negative.   Respiratory: Negative.   Cardiovascular: Negative.   Gastrointestinal: Negative.   Genitourinary: Negative.   Musculoskeletal: Negative.   Skin: Negative.   Neurological: Negative.   Endo/Heme/Allergies: Negative.   Psychiatric/Behavioral: Negative.     Past medical history:  Past Medical History:  Diagnosis Date  . Asthma    1990  . Eczema   . Urticaria     Past surgical history:  Reviewed.  No pertinent surgical history reported.  Family history: Family History  Problem Relation Age of Onset  . Allergic rhinitis Mother   . Asthma Sister     Social history: Social History   Social History  . Marital status: Unknown    Spouse name: N/A  . Number of children: N/A  . Years of education: N/A   Occupational  History  . Not on file.   Social History Main Topics  . Smoking status: Never Smoker  . Smokeless tobacco: Never Used  . Alcohol use Not on file  . Drug use: Unknown  . Sexual activity: Not on file   Other Topics Concern  . Not on file   Social History Narrative  . No narrative on file   Environmental History: The patient lives in a house with carpeting in the bedroom, and central air and heat.  She is a nonsmoker without pets. She is a part-time [employer] loss adjuster.  Allergies as of 08/18/2016      Reactions   Amoxicillin    Clarithromycin    Iohexol    Other    Biorion   Peanut-containing Drug Products       Medication List       Accurate as of 08/18/16  1:34 PM. Always use your most recent med list.          ALFALFA PO Take by mouth.   amLODipine 10 MG tablet Commonly known as:  NORVASC   azelastine 0.1 % nasal spray Commonly known as:  ASTELIN Use 1-2 sprays per nostril 2 times daily as needed   b complex  vitamins tablet Take 1 tablet by mouth daily.   Black Currant Seed Oil 500 MG Caps Take by mouth.   calcium citrate-vitamin D 315-200 MG-UNIT tablet Commonly known as:  CITRACAL+D Take 1 tablet by mouth 2 (two) times daily.   cyclobenzaprine 10 MG tablet Commonly known as:  FLEXERIL Take 10 mg by mouth 3 (three) times daily as needed for muscle spasms.   dorzolamide-timolol 22.3-6.8 MG/ML ophthalmic solution Commonly known as:  COSOPT 1 drop 2 (two) times daily.   DULoxetine 20 MG capsule Commonly known as:  CYMBALTA Take 20 mg by mouth daily.   EPINEPHrine 0.3 mg/0.3 mL Soaj injection Commonly known as:  EPI-PEN Use as directed for severe allergic reaction   Melatonin 10 MG Tabs Take by mouth.   meloxicam 7.5 MG tablet Commonly known as:  MOBIC Take 7.5 mg by mouth daily.   Olopatadine HCl 0.2 % Soln Commonly known as:  PATADAY Place 1 drop into both eyes daily.   Omega 3 1000 MG Caps Take by mouth.   Red Yeast Rice 600 MG  Tabs Take by mouth.   TART CHERRY ADVANCED PO Take by mouth.   telmisartan 80 MG tablet Commonly known as:  MICARDIS   timolol 0.5 % ophthalmic solution Commonly known as:  BETIMOL 1 drop 2 (two) times daily.   traMADol 50 MG tablet Commonly known as:  ULTRAM Take by mouth every 6 (six) hours as needed.   vitamin A 10000 UNIT capsule Take 10,000 Units by mouth daily.   vitamin C 100 MG tablet Take 100 mg by mouth daily.   ZINC 15 PO Take by mouth.       Known medication allergies: Allergies  Allergen Reactions  . Amoxicillin   . Clarithromycin   . Iohexol   . Other     Biorion  . Peanut-Containing Drug Products     I appreciate the opportunity to take part in Charvi's care. Please do not hesitate to contact me with questions.  Sincerely,   R. Edgar Frisk, MD

## 2016-08-18 NOTE — Assessment & Plan Note (Signed)
The patient's history suggests peanut allergy.  Her food allergen skin testing was negative to peanut but borderline positive to hazelnut.  We will attempt to clarify the etiology with blood work.  A lab order form has been provided for serum specific IgE against peanut, peanut component, and tree nut panel.  Until food allergy has been definitively ruled out, she will continue to avoid peanut as well as tree nuts and have access to epinephrine autoinjectors.  A prescription has been provided for epinephrine 0.3 mg autoinjector 2 pack along with instructions for its proper administration.

## 2016-08-19 ENCOUNTER — Telehealth: Payer: Self-pay | Admitting: Allergy and Immunology

## 2016-08-19 NOTE — Telephone Encounter (Signed)
It will be 2 injections given at the same time, so she will be able to have them in [place] if she wants to. Let me know and I'll get the orders written. Thanks.

## 2016-08-19 NOTE — Telephone Encounter (Signed)
Pt called and said that her ins will pay at 100% and wants to know if it will be one inj or 2 inj . If 1 inj she will go to [place] if it is going to be 2 inj she will have to come to [place]. [phone]

## 2016-08-19 NOTE — Telephone Encounter (Signed)
I spoke with the patient. She is okay with this plan. Does question how many times weekly because [facility] is only open on Tuesdays. Please advise on scheduling and frequency. Thank you.

## 2016-08-19 NOTE — Telephone Encounter (Signed)
Disregard previous message.   Left detailed for patient with schedule and frequency information. Also advise dto call back 09/14/16 to check on status of vials.

## 2016-08-23 LAB — IGE NUT PROF. W/COMPONENT RFLX
Brazil Nut IgE: <0.1 kU/L
F020-IgE Almond: <0.1 kU/L
F202-IgE Cashew Nut: <0.1 kU/L
F203-IgE Pistachio Nut: 0.14 kU/L — AB
Hazelnut (Filbert) IgE: <0.1 kU/L
Macadamia Nut, IgE: <0.1 kU/L
Peanut IgE: 0.11 kU/L — AB
Pecan Nut IgE: <0.1 kU/L
Walnut IgE: <0.1 kU/L

## 2016-08-23 LAB — PANEL 603847
F352-IgE Ara h 8: <0.1 kU/L
F422-IgE Ara h 1: <0.1 kU/L
F423-IgE Ara h 2: <0.1 kU/L
F424-IgE Ara h 3: <0.1 kU/L
F427-IgE Ara h 9: <0.1 kU/L

## 2016-08-24 NOTE — Telephone Encounter (Signed)
Noted. Thanks.

## 2016-08-25 DIAGNOSIS — Z803 Family history of malignant neoplasm of breast: Secondary | ICD-10-CM | POA: Diagnosis not present

## 2016-08-25 DIAGNOSIS — Z1231 Encounter for screening mammogram for malignant neoplasm of breast: Secondary | ICD-10-CM | POA: Diagnosis not present

## 2016-09-09 DIAGNOSIS — E785 Hyperlipidemia, unspecified: Secondary | ICD-10-CM | POA: Diagnosis not present

## 2016-09-11 DIAGNOSIS — M797 Fibromyalgia: Secondary | ICD-10-CM | POA: Diagnosis not present

## 2016-09-11 DIAGNOSIS — Z6834 Body mass index (BMI) 34.0-34.9, adult: Secondary | ICD-10-CM | POA: Diagnosis not present

## 2016-09-11 DIAGNOSIS — E785 Hyperlipidemia, unspecified: Secondary | ICD-10-CM | POA: Diagnosis not present

## 2016-09-11 DIAGNOSIS — I10 Essential (primary) hypertension: Secondary | ICD-10-CM | POA: Diagnosis not present

## 2016-09-15 ENCOUNTER — Ambulatory Visit: Payer: Self-pay | Admitting: Allergy & Immunology

## 2016-09-22 ENCOUNTER — Encounter: Payer: Self-pay | Admitting: Allergy and Immunology

## 2016-10-21 DIAGNOSIS — H04123 Dry eye syndrome of bilateral lacrimal glands: Secondary | ICD-10-CM | POA: Diagnosis not present

## 2016-10-21 DIAGNOSIS — H40003 Preglaucoma, unspecified, bilateral: Secondary | ICD-10-CM | POA: Diagnosis not present

## 2016-11-10 DIAGNOSIS — I10 Essential (primary) hypertension: Secondary | ICD-10-CM | POA: Diagnosis not present

## 2016-11-10 DIAGNOSIS — M797 Fibromyalgia: Secondary | ICD-10-CM | POA: Diagnosis not present

## 2016-11-10 DIAGNOSIS — Z6833 Body mass index (BMI) 33.0-33.9, adult: Secondary | ICD-10-CM | POA: Diagnosis not present

## 2016-11-10 DIAGNOSIS — M542 Cervicalgia: Secondary | ICD-10-CM | POA: Diagnosis not present

## 2016-11-16 DIAGNOSIS — M542 Cervicalgia: Secondary | ICD-10-CM | POA: Diagnosis not present

## 2016-11-16 DIAGNOSIS — M546 Pain in thoracic spine: Secondary | ICD-10-CM | POA: Diagnosis not present

## 2016-11-16 DIAGNOSIS — M9902 Segmental and somatic dysfunction of thoracic region: Secondary | ICD-10-CM | POA: Diagnosis not present

## 2016-11-16 DIAGNOSIS — M9903 Segmental and somatic dysfunction of lumbar region: Secondary | ICD-10-CM | POA: Diagnosis not present

## 2016-11-16 DIAGNOSIS — M9901 Segmental and somatic dysfunction of cervical region: Secondary | ICD-10-CM | POA: Diagnosis not present

## 2016-11-16 DIAGNOSIS — M545 Low back pain: Secondary | ICD-10-CM | POA: Diagnosis not present

## 2016-11-18 ENCOUNTER — Other Ambulatory Visit: Payer: Self-pay | Admitting: Internal Medicine

## 2016-11-18 DIAGNOSIS — M546 Pain in thoracic spine: Secondary | ICD-10-CM | POA: Diagnosis not present

## 2016-11-18 DIAGNOSIS — M9901 Segmental and somatic dysfunction of cervical region: Secondary | ICD-10-CM | POA: Diagnosis not present

## 2016-11-18 DIAGNOSIS — M9903 Segmental and somatic dysfunction of lumbar region: Secondary | ICD-10-CM | POA: Diagnosis not present

## 2016-11-18 DIAGNOSIS — M542 Cervicalgia: Secondary | ICD-10-CM

## 2016-11-18 DIAGNOSIS — M9902 Segmental and somatic dysfunction of thoracic region: Secondary | ICD-10-CM | POA: Diagnosis not present

## 2016-11-18 DIAGNOSIS — M5030 Other cervical disc degeneration, unspecified cervical region: Secondary | ICD-10-CM

## 2016-11-18 DIAGNOSIS — M545 Low back pain: Secondary | ICD-10-CM | POA: Diagnosis not present

## 2016-11-20 ENCOUNTER — Ambulatory Visit (HOSPITAL_COMMUNITY)
Admission: RE | Admit: 2016-11-20 | Discharge: 2016-11-20 | Disposition: A | Discharge status: Home / Self Care | Payer: Medicare Other | Source: Ambulatory Visit | Attending: Internal Medicine | Admitting: Internal Medicine

## 2016-11-20 DIAGNOSIS — M545 Low back pain: Secondary | ICD-10-CM | POA: Diagnosis not present

## 2016-11-20 DIAGNOSIS — M5030 Other cervical disc degeneration, unspecified cervical region: Secondary | ICD-10-CM | POA: Diagnosis not present

## 2016-11-20 DIAGNOSIS — M9901 Segmental and somatic dysfunction of cervical region: Secondary | ICD-10-CM | POA: Diagnosis not present

## 2016-11-20 DIAGNOSIS — M542 Cervicalgia: Secondary | ICD-10-CM

## 2016-11-20 DIAGNOSIS — M9902 Segmental and somatic dysfunction of thoracic region: Secondary | ICD-10-CM | POA: Diagnosis not present

## 2016-11-20 DIAGNOSIS — M9903 Segmental and somatic dysfunction of lumbar region: Secondary | ICD-10-CM | POA: Diagnosis not present

## 2016-11-20 DIAGNOSIS — M546 Pain in thoracic spine: Secondary | ICD-10-CM | POA: Diagnosis not present

## 2016-11-24 DIAGNOSIS — M546 Pain in thoracic spine: Secondary | ICD-10-CM | POA: Diagnosis not present

## 2016-11-24 DIAGNOSIS — M9903 Segmental and somatic dysfunction of lumbar region: Secondary | ICD-10-CM | POA: Diagnosis not present

## 2016-11-24 DIAGNOSIS — M9901 Segmental and somatic dysfunction of cervical region: Secondary | ICD-10-CM | POA: Diagnosis not present

## 2016-11-24 DIAGNOSIS — M542 Cervicalgia: Secondary | ICD-10-CM | POA: Diagnosis not present

## 2016-11-24 DIAGNOSIS — M9902 Segmental and somatic dysfunction of thoracic region: Secondary | ICD-10-CM | POA: Diagnosis not present

## 2016-11-24 DIAGNOSIS — M545 Low back pain: Secondary | ICD-10-CM | POA: Diagnosis not present

## 2016-12-02 DIAGNOSIS — M9901 Segmental and somatic dysfunction of cervical region: Secondary | ICD-10-CM | POA: Diagnosis not present

## 2016-12-02 DIAGNOSIS — M9902 Segmental and somatic dysfunction of thoracic region: Secondary | ICD-10-CM | POA: Diagnosis not present

## 2016-12-02 DIAGNOSIS — M9903 Segmental and somatic dysfunction of lumbar region: Secondary | ICD-10-CM | POA: Diagnosis not present

## 2016-12-02 DIAGNOSIS — M542 Cervicalgia: Secondary | ICD-10-CM | POA: Diagnosis not present

## 2016-12-02 DIAGNOSIS — M546 Pain in thoracic spine: Secondary | ICD-10-CM | POA: Diagnosis not present

## 2016-12-02 DIAGNOSIS — M545 Low back pain: Secondary | ICD-10-CM | POA: Diagnosis not present

## 2016-12-04 DIAGNOSIS — M542 Cervicalgia: Secondary | ICD-10-CM | POA: Diagnosis not present

## 2016-12-04 DIAGNOSIS — Z6834 Body mass index (BMI) 34.0-34.9, adult: Secondary | ICD-10-CM | POA: Diagnosis not present

## 2016-12-04 DIAGNOSIS — M25511 Pain in right shoulder: Secondary | ICD-10-CM | POA: Diagnosis not present

## 2016-12-04 DIAGNOSIS — I10 Essential (primary) hypertension: Secondary | ICD-10-CM | POA: Diagnosis not present

## 2016-12-04 DIAGNOSIS — R0602 Shortness of breath: Secondary | ICD-10-CM | POA: Diagnosis not present

## 2016-12-04 DIAGNOSIS — Z720 Tobacco use: Secondary | ICD-10-CM | POA: Diagnosis not present

## 2016-12-04 DIAGNOSIS — R350 Frequency of micturition: Secondary | ICD-10-CM | POA: Diagnosis not present

## 2016-12-04 DIAGNOSIS — M797 Fibromyalgia: Secondary | ICD-10-CM | POA: Diagnosis not present

## 2016-12-11 DIAGNOSIS — M9902 Segmental and somatic dysfunction of thoracic region: Secondary | ICD-10-CM | POA: Diagnosis not present

## 2016-12-11 DIAGNOSIS — M546 Pain in thoracic spine: Secondary | ICD-10-CM | POA: Diagnosis not present

## 2016-12-11 DIAGNOSIS — M9901 Segmental and somatic dysfunction of cervical region: Secondary | ICD-10-CM | POA: Diagnosis not present

## 2016-12-11 DIAGNOSIS — M545 Low back pain: Secondary | ICD-10-CM | POA: Diagnosis not present

## 2016-12-11 DIAGNOSIS — M542 Cervicalgia: Secondary | ICD-10-CM | POA: Diagnosis not present

## 2016-12-11 DIAGNOSIS — M9903 Segmental and somatic dysfunction of lumbar region: Secondary | ICD-10-CM | POA: Diagnosis not present

## 2016-12-17 ENCOUNTER — Other Ambulatory Visit (HOSPITAL_COMMUNITY): Payer: Self-pay | Admitting: Internal Medicine

## 2016-12-17 DIAGNOSIS — Z87891 Personal history of nicotine dependence: Secondary | ICD-10-CM

## 2016-12-21 DIAGNOSIS — M545 Low back pain: Secondary | ICD-10-CM | POA: Diagnosis not present

## 2016-12-21 DIAGNOSIS — M546 Pain in thoracic spine: Secondary | ICD-10-CM | POA: Diagnosis not present

## 2016-12-21 DIAGNOSIS — M9901 Segmental and somatic dysfunction of cervical region: Secondary | ICD-10-CM | POA: Diagnosis not present

## 2016-12-21 DIAGNOSIS — M9902 Segmental and somatic dysfunction of thoracic region: Secondary | ICD-10-CM | POA: Diagnosis not present

## 2016-12-21 DIAGNOSIS — M542 Cervicalgia: Secondary | ICD-10-CM | POA: Diagnosis not present

## 2016-12-21 DIAGNOSIS — M9903 Segmental and somatic dysfunction of lumbar region: Secondary | ICD-10-CM | POA: Diagnosis not present

## 2017-01-04 DIAGNOSIS — Z6834 Body mass index (BMI) 34.0-34.9, adult: Secondary | ICD-10-CM | POA: Diagnosis not present

## 2017-01-04 DIAGNOSIS — I10 Essential (primary) hypertension: Secondary | ICD-10-CM | POA: Diagnosis not present

## 2017-01-05 ENCOUNTER — Ambulatory Visit: Payer: Medicare Other

## 2017-01-06 ENCOUNTER — Ambulatory Visit (HOSPITAL_COMMUNITY)
Admission: RE | Admit: 2017-01-06 | Discharge: 2017-01-06 | Disposition: A | Discharge status: Home / Self Care | Payer: Medicare Other | Source: Ambulatory Visit | Attending: Internal Medicine | Admitting: Internal Medicine

## 2017-01-06 DIAGNOSIS — J439 Emphysema, unspecified: Secondary | ICD-10-CM | POA: Insufficient documentation

## 2017-01-06 DIAGNOSIS — Z122 Encounter for screening for malignant neoplasm of respiratory organs: Secondary | ICD-10-CM | POA: Diagnosis not present

## 2017-01-06 DIAGNOSIS — I700 Atherosclerosis of aorta: Secondary | ICD-10-CM | POA: Diagnosis not present

## 2017-01-06 DIAGNOSIS — Z87891 Personal history of nicotine dependence: Secondary | ICD-10-CM | POA: Diagnosis not present

## 2017-02-01 DIAGNOSIS — I10 Essential (primary) hypertension: Secondary | ICD-10-CM | POA: Diagnosis not present

## 2017-02-12 ENCOUNTER — Other Ambulatory Visit: Payer: Self-pay

## 2017-02-13 DIAGNOSIS — H409 Unspecified glaucoma: Secondary | ICD-10-CM | POA: Diagnosis not present

## 2017-02-13 DIAGNOSIS — I10 Essential (primary) hypertension: Secondary | ICD-10-CM | POA: Diagnosis not present

## 2017-02-13 DIAGNOSIS — Z6834 Body mass index (BMI) 34.0-34.9, adult: Secondary | ICD-10-CM | POA: Diagnosis not present

## 2017-02-13 DIAGNOSIS — H04129 Dry eye syndrome of unspecified lacrimal gland: Secondary | ICD-10-CM | POA: Diagnosis not present

## 2017-02-13 DIAGNOSIS — K120 Recurrent oral aphthae: Secondary | ICD-10-CM | POA: Diagnosis not present

## 2017-04-01 DIAGNOSIS — H60511 Acute actinic otitis externa, right ear: Secondary | ICD-10-CM | POA: Diagnosis not present

## 2017-04-01 DIAGNOSIS — H6500 Acute serous otitis media, unspecified ear: Secondary | ICD-10-CM | POA: Diagnosis not present

## 2017-04-01 DIAGNOSIS — Z6834 Body mass index (BMI) 34.0-34.9, adult: Secondary | ICD-10-CM | POA: Diagnosis not present

## 2017-04-08 DIAGNOSIS — I10 Essential (primary) hypertension: Secondary | ICD-10-CM | POA: Diagnosis not present

## 2017-04-12 DIAGNOSIS — M797 Fibromyalgia: Secondary | ICD-10-CM | POA: Diagnosis not present

## 2017-04-12 DIAGNOSIS — Z6834 Body mass index (BMI) 34.0-34.9, adult: Secondary | ICD-10-CM | POA: Diagnosis not present

## 2017-04-12 DIAGNOSIS — Z0000 Encounter for general adult medical examination without abnormal findings: Secondary | ICD-10-CM | POA: Diagnosis not present

## 2017-04-12 DIAGNOSIS — E785 Hyperlipidemia, unspecified: Secondary | ICD-10-CM | POA: Diagnosis not present

## 2017-04-12 DIAGNOSIS — I10 Essential (primary) hypertension: Secondary | ICD-10-CM | POA: Diagnosis not present

## 2017-04-19 DIAGNOSIS — I10 Essential (primary) hypertension: Secondary | ICD-10-CM | POA: Diagnosis not present

## 2017-04-19 DIAGNOSIS — Z6834 Body mass index (BMI) 34.0-34.9, adult: Secondary | ICD-10-CM | POA: Diagnosis not present

## 2017-05-07 DIAGNOSIS — H04123 Dry eye syndrome of bilateral lacrimal glands: Secondary | ICD-10-CM | POA: Diagnosis not present

## 2017-05-07 DIAGNOSIS — H40003 Preglaucoma, unspecified, bilateral: Secondary | ICD-10-CM | POA: Diagnosis not present

## 2017-05-07 DIAGNOSIS — H2513 Age-related nuclear cataract, bilateral: Secondary | ICD-10-CM | POA: Diagnosis not present

## 2017-06-22 DIAGNOSIS — M797 Fibromyalgia: Secondary | ICD-10-CM | POA: Diagnosis not present

## 2017-06-22 DIAGNOSIS — Z6834 Body mass index (BMI) 34.0-34.9, adult: Secondary | ICD-10-CM | POA: Diagnosis not present

## 2017-06-22 DIAGNOSIS — I10 Essential (primary) hypertension: Secondary | ICD-10-CM | POA: Diagnosis not present

## 2017-06-22 DIAGNOSIS — R1032 Left lower quadrant pain: Secondary | ICD-10-CM | POA: Diagnosis not present

## 2017-07-06 DIAGNOSIS — H40053 Ocular hypertension, bilateral: Secondary | ICD-10-CM | POA: Diagnosis not present

## 2017-07-06 DIAGNOSIS — H04123 Dry eye syndrome of bilateral lacrimal glands: Secondary | ICD-10-CM | POA: Diagnosis not present

## 2017-07-06 DIAGNOSIS — H2513 Age-related nuclear cataract, bilateral: Secondary | ICD-10-CM | POA: Diagnosis not present

## 2017-09-13 DIAGNOSIS — M542 Cervicalgia: Secondary | ICD-10-CM | POA: Diagnosis not present

## 2017-09-13 DIAGNOSIS — M545 Low back pain: Secondary | ICD-10-CM | POA: Diagnosis not present

## 2017-09-13 DIAGNOSIS — M9903 Segmental and somatic dysfunction of lumbar region: Secondary | ICD-10-CM | POA: Diagnosis not present

## 2017-09-13 DIAGNOSIS — M9902 Segmental and somatic dysfunction of thoracic region: Secondary | ICD-10-CM | POA: Diagnosis not present

## 2017-09-13 DIAGNOSIS — M546 Pain in thoracic spine: Secondary | ICD-10-CM | POA: Diagnosis not present

## 2017-09-13 DIAGNOSIS — M9901 Segmental and somatic dysfunction of cervical region: Secondary | ICD-10-CM | POA: Diagnosis not present

## 2017-09-15 DIAGNOSIS — M545 Low back pain: Secondary | ICD-10-CM | POA: Diagnosis not present

## 2017-09-15 DIAGNOSIS — M9903 Segmental and somatic dysfunction of lumbar region: Secondary | ICD-10-CM | POA: Diagnosis not present

## 2017-09-15 DIAGNOSIS — M9901 Segmental and somatic dysfunction of cervical region: Secondary | ICD-10-CM | POA: Diagnosis not present

## 2017-09-15 DIAGNOSIS — M542 Cervicalgia: Secondary | ICD-10-CM | POA: Diagnosis not present

## 2017-09-15 DIAGNOSIS — M546 Pain in thoracic spine: Secondary | ICD-10-CM | POA: Diagnosis not present

## 2017-09-15 DIAGNOSIS — M9902 Segmental and somatic dysfunction of thoracic region: Secondary | ICD-10-CM | POA: Diagnosis not present

## 2017-09-17 DIAGNOSIS — M542 Cervicalgia: Secondary | ICD-10-CM | POA: Diagnosis not present

## 2017-09-17 DIAGNOSIS — M545 Low back pain: Secondary | ICD-10-CM | POA: Diagnosis not present

## 2017-09-17 DIAGNOSIS — M546 Pain in thoracic spine: Secondary | ICD-10-CM | POA: Diagnosis not present

## 2017-09-17 DIAGNOSIS — M9903 Segmental and somatic dysfunction of lumbar region: Secondary | ICD-10-CM | POA: Diagnosis not present

## 2017-09-17 DIAGNOSIS — M9902 Segmental and somatic dysfunction of thoracic region: Secondary | ICD-10-CM | POA: Diagnosis not present

## 2017-09-17 DIAGNOSIS — M9901 Segmental and somatic dysfunction of cervical region: Secondary | ICD-10-CM | POA: Diagnosis not present

## 2017-10-06 DIAGNOSIS — K649 Unspecified hemorrhoids: Secondary | ICD-10-CM | POA: Diagnosis not present

## 2017-10-07 DIAGNOSIS — Z803 Family history of malignant neoplasm of breast: Secondary | ICD-10-CM | POA: Diagnosis not present

## 2017-10-07 DIAGNOSIS — Z1231 Encounter for screening mammogram for malignant neoplasm of breast: Secondary | ICD-10-CM | POA: Diagnosis not present

## 2017-10-14 ENCOUNTER — Encounter: Payer: Self-pay | Admitting: Gastroenterology

## 2017-10-14 DIAGNOSIS — E785 Hyperlipidemia, unspecified: Secondary | ICD-10-CM | POA: Diagnosis not present

## 2017-10-14 DIAGNOSIS — I10 Essential (primary) hypertension: Secondary | ICD-10-CM | POA: Diagnosis not present

## 2017-10-19 ENCOUNTER — Encounter: Payer: Self-pay | Admitting: Gastroenterology

## 2017-10-21 DIAGNOSIS — E782 Mixed hyperlipidemia: Secondary | ICD-10-CM | POA: Diagnosis not present

## 2017-10-21 DIAGNOSIS — I10 Essential (primary) hypertension: Secondary | ICD-10-CM | POA: Diagnosis not present

## 2017-10-21 DIAGNOSIS — M797 Fibromyalgia: Secondary | ICD-10-CM | POA: Diagnosis not present

## 2017-10-21 DIAGNOSIS — Z6834 Body mass index (BMI) 34.0-34.9, adult: Secondary | ICD-10-CM | POA: Diagnosis not present

## 2017-10-21 DIAGNOSIS — K648 Other hemorrhoids: Secondary | ICD-10-CM | POA: Diagnosis not present

## 2017-10-21 DIAGNOSIS — R7301 Impaired fasting glucose: Secondary | ICD-10-CM | POA: Diagnosis not present

## 2017-11-02 ENCOUNTER — Encounter: Payer: Self-pay | Admitting: Gastroenterology

## 2017-12-20 ENCOUNTER — Telehealth: Payer: Self-pay | Admitting: Gastroenterology

## 2017-12-20 ENCOUNTER — Encounter: Payer: Self-pay | Admitting: Gastroenterology

## 2017-12-20 ENCOUNTER — Ambulatory Visit (INDEPENDENT_AMBULATORY_CARE_PROVIDER_SITE_OTHER): Payer: Medicare Other | Admitting: Gastroenterology

## 2017-12-20 ENCOUNTER — Telehealth: Payer: Self-pay

## 2017-12-20 ENCOUNTER — Ambulatory Visit: Payer: Federal, State, Local not specified - PPO | Admitting: Gastroenterology

## 2017-12-20 DIAGNOSIS — R1032 Left lower quadrant pain: Secondary | ICD-10-CM

## 2017-12-20 DIAGNOSIS — K649 Unspecified hemorrhoids: Secondary | ICD-10-CM

## 2017-12-20 NOTE — Patient Instructions (Signed)
1. We will obtain records and be in touch with further recommendations.  2. Continue proctofoam as needed.

## 2017-12-20 NOTE — Telephone Encounter (Signed)
Routing message 

## 2017-12-20 NOTE — Progress Notes (Signed)
Primary Care Physician:  Celene Squibb, MD  Primary Gastroenterologist:  Barney Drain, MD   Chief Complaint  Patient presents with  . Hemorrhoids  . Abdominal Pain    llq    HPI:  Stacey Atkinson is a 72 y.o. female here for further evaluation of hemorrhoids at the request of Dr. Nevada Crane.  Patient states she is had issues with hemorrhoids off and on.  She became alarmed more recently because she felt like she had a ball in her rectum which would not go away.  Stools became more thin, pencillike.  Still having 3 bowel movements a day however.  Denies rectal pain.  No recent rectal bleeding however she has had some in the past.  She has been using Proctofoam with relief.  She reports her last colonoscopy was in August 2014, prior to that was in 2007.  She reports on the first colonoscopy she was advised to come back in 5 years.  The last time she was told to come back in 10 years.  Colonoscopy was performed by Dr.Rajesh Lyndel Safe Alinda Deem) in [place], [place] at [facility].  Patient states he has since left the practice and is somewhere in [place].  She states that she has a long history of left lower quadrant pain and back when she was seeing Dr. Lyndel Safe it was determined that her pain was from a pinched nerve from her back.  She was treated by chiropractor and had complete resolution of her pain.  Recently she started having left lower quadrant pain again, keeps her up at night.  She notes that she does eat pumpkin seeds at night and wonders if it is related to that.  She used to eat almonds but has become allergic to them.  She reports she has been treated empirically for diverticulitis by PCP for this left lower quadrant pain and had improvement in symptoms as well.  Her pain is intermittent.  Not necessarily related to her bowels.  No associated fever.  Seems to be worse with movement.  She states she has had a lot of stress, taking care of family members.  She believes that her blood  pressure is elevated due to white coat hypertension.  She also has been forgetting to take her blood pressure medications on a regular basis.   Current Outpatient Medications  Medication Sig Dispense Refill  . ALFALFA PO Take by mouth.    . Ascorbic Acid (VITAMIN C) 100 MG tablet Take 100 mg by mouth daily.    Marland Kitchen b complex vitamins tablet Take 1 tablet by mouth daily.    . Black Currant Seed Oil 500 MG CAPS Take by mouth.    . calcium citrate-vitamin D (CITRACAL+D) 315-200 MG-UNIT tablet Take 1 tablet by mouth 2 (two) times daily.    . cyclobenzaprine (FLEXERIL) 10 MG tablet Take 10 mg by mouth 3 (three) times daily as needed for muscle spasms.    . dorzolamide-timolol (COSOPT) 22.3-6.8 MG/ML ophthalmic solution 1 drop 2 (two) times daily.    Marland Kitchen EPINEPHrine 0.3 mg/0.3 mL IJ SOAJ injection Use as directed for severe allergic reaction 2 Device 1  . ezetimibe (ZETIA) 10 MG tablet Take 10 mg by mouth daily.    Marland Kitchen latanoprost (XALATAN) 0.005 % ophthalmic solution 1 drop at bedtime.    Marland Kitchen losartan (COZAAR) 100 MG tablet Take 100 mg by mouth daily.    . Melatonin 10 MG TABS Take by mouth.    . meloxicam (MOBIC) 7.5 MG tablet Take  7.5 mg by mouth as needed.     . metoprolol tartrate (LOPRESSOR) 50 MG tablet Take 50 mg by mouth daily.    . Olopatadine HCl (PATADAY) 0.2 % SOLN Place 1 drop into both eyes daily. 1 Bottle 5  . Omega 3 1000 MG CAPS Take by mouth.    . Oxycodone HCl 10 MG TABS Take 10 mg by mouth as needed.    . timolol (BETIMOL) 0.5 % ophthalmic solution 1 drop 2 (two) times daily.    . traMADol (ULTRAM) 50 MG tablet Take by mouth every 6 (six) hours as needed.    . Zinc Sulfate (ZINC 15 PO) Take by mouth.     No current facility-administered medications for this visit.     Allergies as of 12/20/2017 - Review Complete 12/20/2017  Allergen Reaction Noted  . Amoxicillin  08/18/2016  . Clarithromycin  08/18/2016  . Iohexol  08/18/2016  . Metaxalone  12/20/2017  . Other  08/18/2016  .  Oxaprozin  12/20/2017  . Peanut-containing drug products  08/18/2016  . Potassium clavulanate [clavulanic acid]  12/20/2017  . Trimethoprim  12/20/2017  . Sulfa antibiotics Rash 12/20/2017    Past Medical History:  Diagnosis Date  . Asthma    1990. no problems in years  . Eczema   . Glaucoma   . HTN (hypertension)   . Hyperlipidemia   . Urticaria     Past Surgical History:  Procedure Laterality Date  . CESAREAN SECTION  1974/1979  . EYE SURGERY Right 1963   right orbit rebuilt    Family History  Problem Relation Age of Onset  . Allergic rhinitis Mother   . Pancreatic cancer Mother   . Asthma Sister   . Inflammatory bowel disease Neg Hx   . Colon cancer Neg Hx   . Celiac disease Neg Hx     Social History   Socioeconomic History  . Marital status: Unknown    Spouse name: Not on file  . Number of children: Not on file  . Years of education: Not on file  . Highest education level: Not on file  Occupational History  . Not on file  Social Needs  . Financial resource strain: Not on file  . Food insecurity:    Worry: Not on file    Inability: Not on file  . Transportation needs:    Medical: Not on file    Non-medical: Not on file  Tobacco Use  . Smoking status: Never Smoker  . Smokeless tobacco: Never Used  Substance and Sexual Activity  . Alcohol use: Yes    Comment: occ  . Drug use: Never  . Sexual activity: Not on file  Lifestyle  . Physical activity:    Days per week: Not on file    Minutes per session: Not on file  . Stress: Not on file  Relationships  . Social connections:    Talks on phone: Not on file    Gets together: Not on file    Attends religious service: Not on file    Active member of club or organization: Not on file    Attends meetings of clubs or organizations: Not on file    Relationship status: Not on file  . Intimate partner violence:    Fear of current or ex partner: Not on file    Emotionally abused: Not on file    Physically  abused: Not on file    Forced sexual activity: Not on file  Other Topics  Concern  . Not on file  Social History Narrative  . Not on file      ROS:  General: Negative for anorexia, weight loss, fever, chills, fatigue, weakness. Eyes: Negative for vision changes.  ENT: Negative for hoarseness, difficulty swallowing, nasal congestion. CV: Negative for chest pain, angina, palpitations, dyspnea on exertion, peripheral edema.  Respiratory: Negative for dyspnea at rest, dyspnea on exertion, cough, sputum, wheezing.  GI: See history of present illness. GU:  Negative for dysuria, hematuria, urinary incontinence, urinary frequency, nocturnal urination.  MS: Negative for joint pain, low back pain.  Derm: Negative for rash or itching.  Neuro: Negative for weakness, abnormal sensation, seizure, frequent headaches, memory loss, confusion.  Psych: Negative for anxiety, depression, suicidal ideation, hallucinations.  Endo: Negative for unusual weight change.  Heme: Negative for bruising or bleeding. Allergy: Negative for rash or hives.    Physical Examination:  BP (!) 187/83   Pulse 65   Temp (!) 97.3 F (36.3 C) (Oral)   Ht 5' 7.5" (1.715 m)   Wt 216 lb 9.6 oz (98.2 kg)   BMI 33.42 kg/m    General: Well-nourished, well-developed in no acute distress.  Head: Normocephalic, atraumatic.   Eyes: Conjunctiva pink, no icterus. Mouth: Oropharyngeal mucosa moist and pink , no lesions erythema or exudate. Neck: Supple without thyromegaly, masses, or lymphadenopathy.  Lungs: Clear to auscultation bilaterally.  Heart: Regular rate and rhythm, no murmurs rubs or gallops.  Abdomen: Bowel sounds are normal,  nondistended, no hepatosplenomegaly or masses, no abdominal bruits or    hernia , no rebound or guarding.  Minimal left mid last left lower quadrant abdominal tenderness Rectal: Large hemorrhoids noted externally, not reducible.  Internal digital rectal exam with palpable internal hemorrhoids,  nontender exam, stool heme-negative.  Good anal tone.  No evidence of rectal prolapse with bearing down. Extremities: No lower extremity edema. No clubbing or deformities.  Neuro: Alert and oriented x 4 , grossly normal neurologically.  Skin: Warm and dry, no rash or jaundice.   Psych: Alert and cooperative, normal mood and affect.  Labs: Labs from 10/14/2017 White blood cell count 4400, hemoglobin 13.6, hematocrit 40.2, platelets 256,000, glucose 100, BUN 18, creatinine 0.93, sodium 143, potassium 4.5, albumin 4.1, total bilirubin 0.3, alkaline phosphatase 92, AST 18, ALT 11  Imaging Studies: No results found.

## 2017-12-20 NOTE — Telephone Encounter (Signed)
Opened in error

## 2017-12-20 NOTE — Telephone Encounter (Signed)
Pt during check out asked if [facility] would mail her prescription instead of calling it in to her pharmacy.

## 2017-12-21 MED ORDER — HYDROCORTISONE ACE-PRAMOXINE 1-1 % RE FOAM: 1.0000 | Freq: Three times a day (TID) | RECTAL | 3 refills | Status: AC

## 2017-12-21 NOTE — Telephone Encounter (Signed)
Which medication is she wanting?? I didn't prescribe anything yesterday, wondering if she is needing refill on Stacey Atkinson.

## 2017-12-21 NOTE — Telephone Encounter (Signed)
Yes, she would like refill on Proctofoam.

## 2017-12-21 NOTE — Addendum Note (Signed)
Addended by: Mahala Menghini on: 12/21/2017 12:59 PM   Modules accepted: Orders

## 2017-12-21 NOTE — Telephone Encounter (Signed)
RX done. 

## 2017-12-21 NOTE — Assessment & Plan Note (Signed)
72 year old female presenting for further evaluation of hemorrhoids.  Patient reports recently her hemorrhoids were inflamed, she felt like she had a ball in her rectum, stools became thin, no recent rectal bleeding or rectal pain.  She is been using Proctofoam with some relief.  No longer has sensation of something in her rectum.  Stools occur daily but still remains small caliber.  We discussed possibility of hemorrhoid banding with the caveat that this would only treat internal hemorrhoids.  I suspect she has both internal and external hemorrhoids based on her description.  If she is not a candidate for banding, next definitive treatment would be surgical excision.  I want to review her last colonoscopy report to determine if she needs an updated colonoscopy prior to intervention of her hemorrhoids.  Further recommendations to follow.

## 2017-12-21 NOTE — Assessment & Plan Note (Signed)
Patient gives history of remote left lower quadrant pain related to a pinched nerve.  By description she has more of musculoskeletal component to her pain.  Pain unrelated to her bowel function.  I do not suspect diverticulitis based on today's exam although she may have had recent episode that responded to antibiotics.  Will review upcoming records.  Further recommendations to follow with regards to colonoscopy for further evaluation.

## 2017-12-21 NOTE — Telephone Encounter (Signed)
Pt notified that RX was sent to her pharmacy.  

## 2017-12-22 NOTE — Progress Notes (Signed)
cc'ed to pcp °

## 2017-12-24 ENCOUNTER — Telehealth: Payer: Self-pay | Admitting: Gastroenterology

## 2017-12-24 ENCOUNTER — Encounter: Payer: Self-pay | Admitting: Gastroenterology

## 2017-12-24 DIAGNOSIS — R194 Change in bowel habit: Secondary | ICD-10-CM

## 2017-12-24 DIAGNOSIS — R1032 Left lower quadrant pain: Secondary | ICD-10-CM

## 2017-12-24 NOTE — Telephone Encounter (Signed)
Some of her records received. TCS by Dr. Lyndel Safe 2014 with mild sigmoid diverticulosis, int/ext hemorrhoids. 10 year f/u TCS recommended.   Patient has three concerns: Left sided abd pain Change in bowel habits. Hemorrhoids (ext/int).    At this point I would recommend CT A/P with CM IF she is still having LLQ pain. If unremarkable, then we would move towards offering updated TCS with ??hemorhoid banding if a candidate.

## 2017-12-27 NOTE — Telephone Encounter (Signed)
CT scheduled for 01/11/18 at 10:00am, arrive at 9:45am. Pick up contrast before day of test. NPO 4 hours prior to CT. Called and informed pt. Letter mailed.

## 2017-12-27 NOTE — Telephone Encounter (Signed)
Called [employer]. No PA needed for CT. Ref# [id number].

## 2017-12-27 NOTE — Addendum Note (Signed)
Addended by: Hassan Rowan on: 12/27/2017 01:18 PM   Modules accepted: Orders

## 2017-12-27 NOTE — Telephone Encounter (Signed)
Pt is aware and is still having LLQ pain. Ok to schedule the CT. Forwarding to [facility] to schedule.

## 2018-01-03 NOTE — Telephone Encounter (Signed)
Noted  

## 2018-01-11 ENCOUNTER — Ambulatory Visit (HOSPITAL_COMMUNITY): Admission: RE | Admit: 2018-01-11 | Payer: Federal, State, Local not specified - PPO | Source: Ambulatory Visit

## 2018-01-11 ENCOUNTER — Telehealth: Payer: Self-pay | Admitting: Gastroenterology

## 2018-01-11 DIAGNOSIS — L309 Dermatitis, unspecified: Secondary | ICD-10-CM | POA: Diagnosis not present

## 2018-01-11 DIAGNOSIS — H04129 Dry eye syndrome of unspecified lacrimal gland: Secondary | ICD-10-CM | POA: Diagnosis not present

## 2018-01-11 DIAGNOSIS — K649 Unspecified hemorrhoids: Secondary | ICD-10-CM | POA: Diagnosis not present

## 2018-01-11 DIAGNOSIS — H409 Unspecified glaucoma: Secondary | ICD-10-CM | POA: Diagnosis not present

## 2018-01-11 DIAGNOSIS — R21 Rash and other nonspecific skin eruption: Secondary | ICD-10-CM | POA: Diagnosis not present

## 2018-01-11 DIAGNOSIS — M542 Cervicalgia: Secondary | ICD-10-CM | POA: Diagnosis not present

## 2018-01-11 DIAGNOSIS — E782 Mixed hyperlipidemia: Secondary | ICD-10-CM | POA: Diagnosis not present

## 2018-01-11 DIAGNOSIS — M797 Fibromyalgia: Secondary | ICD-10-CM | POA: Diagnosis not present

## 2018-01-11 DIAGNOSIS — R7301 Impaired fasting glucose: Secondary | ICD-10-CM | POA: Diagnosis not present

## 2018-01-11 DIAGNOSIS — T7840XD Allergy, unspecified, subsequent encounter: Secondary | ICD-10-CM | POA: Diagnosis not present

## 2018-01-11 DIAGNOSIS — E785 Hyperlipidemia, unspecified: Secondary | ICD-10-CM | POA: Diagnosis not present

## 2018-01-11 DIAGNOSIS — R600 Localized edema: Secondary | ICD-10-CM | POA: Diagnosis not present

## 2018-01-11 MED ORDER — PREDNISONE 50 MG PO TABS: ORAL_TABLET | ORAL | 0 refills | Status: DC

## 2018-01-11 NOTE — Telephone Encounter (Signed)
Patient needs to be premedication given contrast allergy.    -->She needs to take Prednisone 50mg  at 13 hours, 7 hours, 1 hour before her CT. I have sent RX to [facility].  -->she needs to take benadryl 50mg  1 hour before her CT. She can buy benadryl over the counter and take two 25mg  tablets (to equal 50mg ).  -->Please make patient aware.

## 2018-01-11 NOTE — Telephone Encounter (Signed)
-----   Message from Sammuel Bailiff sent at 01/11/2018  7:54 AM EDT ----- Regarding: Contrast allergy Contact: [phone] Good morning, The above patient has a documented contrast allergy in [facility]. She was on the schedule for today and she called last night to make sure she wasn't allergic. I rescheduled her for 01/25/2018. She will need the 13 hour pre meds for contrast allergy. I have faxed pre regime. Call me at [phone] if you have any questions.

## 2018-01-11 NOTE — Telephone Encounter (Signed)
Pt is aware. I went over all of the instructions and I am printing this info off and mailing to her. She will call if questions.

## 2018-01-13 DIAGNOSIS — H40053 Ocular hypertension, bilateral: Secondary | ICD-10-CM | POA: Diagnosis not present

## 2018-01-25 ENCOUNTER — Ambulatory Visit (HOSPITAL_COMMUNITY): Payer: Medicare Other

## 2018-02-28 DIAGNOSIS — H40053 Ocular hypertension, bilateral: Secondary | ICD-10-CM | POA: Diagnosis not present

## 2018-02-28 DIAGNOSIS — H04123 Dry eye syndrome of bilateral lacrimal glands: Secondary | ICD-10-CM | POA: Diagnosis not present

## 2018-03-14 DIAGNOSIS — R609 Edema, unspecified: Secondary | ICD-10-CM | POA: Diagnosis not present

## 2018-03-14 DIAGNOSIS — L299 Pruritus, unspecified: Secondary | ICD-10-CM | POA: Diagnosis not present

## 2018-03-14 DIAGNOSIS — L509 Urticaria, unspecified: Secondary | ICD-10-CM | POA: Diagnosis not present

## 2018-03-14 DIAGNOSIS — T7840XA Allergy, unspecified, initial encounter: Secondary | ICD-10-CM | POA: Diagnosis not present

## 2018-03-23 DIAGNOSIS — E785 Hyperlipidemia, unspecified: Secondary | ICD-10-CM | POA: Diagnosis not present

## 2018-03-23 DIAGNOSIS — K648 Other hemorrhoids: Secondary | ICD-10-CM | POA: Diagnosis not present

## 2018-03-23 DIAGNOSIS — M9903 Segmental and somatic dysfunction of lumbar region: Secondary | ICD-10-CM | POA: Diagnosis not present

## 2018-03-23 DIAGNOSIS — R600 Localized edema: Secondary | ICD-10-CM | POA: Diagnosis not present

## 2018-03-23 DIAGNOSIS — R1032 Left lower quadrant pain: Secondary | ICD-10-CM | POA: Diagnosis not present

## 2018-03-23 DIAGNOSIS — M9901 Segmental and somatic dysfunction of cervical region: Secondary | ICD-10-CM | POA: Diagnosis not present

## 2018-03-23 DIAGNOSIS — M545 Low back pain: Secondary | ICD-10-CM | POA: Diagnosis not present

## 2018-03-23 DIAGNOSIS — M546 Pain in thoracic spine: Secondary | ICD-10-CM | POA: Diagnosis not present

## 2018-03-23 DIAGNOSIS — R7301 Impaired fasting glucose: Secondary | ICD-10-CM | POA: Diagnosis not present

## 2018-03-23 DIAGNOSIS — T7840XD Allergy, unspecified, subsequent encounter: Secondary | ICD-10-CM | POA: Diagnosis not present

## 2018-03-23 DIAGNOSIS — E782 Mixed hyperlipidemia: Secondary | ICD-10-CM | POA: Diagnosis not present

## 2018-03-23 DIAGNOSIS — M542 Cervicalgia: Secondary | ICD-10-CM | POA: Diagnosis not present

## 2018-03-23 DIAGNOSIS — M797 Fibromyalgia: Secondary | ICD-10-CM | POA: Diagnosis not present

## 2018-03-23 DIAGNOSIS — K649 Unspecified hemorrhoids: Secondary | ICD-10-CM | POA: Diagnosis not present

## 2018-03-23 DIAGNOSIS — H409 Unspecified glaucoma: Secondary | ICD-10-CM | POA: Diagnosis not present

## 2018-03-23 DIAGNOSIS — R21 Rash and other nonspecific skin eruption: Secondary | ICD-10-CM | POA: Diagnosis not present

## 2018-03-23 DIAGNOSIS — M9902 Segmental and somatic dysfunction of thoracic region: Secondary | ICD-10-CM | POA: Diagnosis not present

## 2018-03-30 DIAGNOSIS — M9903 Segmental and somatic dysfunction of lumbar region: Secondary | ICD-10-CM | POA: Diagnosis not present

## 2018-03-30 DIAGNOSIS — G4700 Insomnia, unspecified: Secondary | ICD-10-CM | POA: Diagnosis not present

## 2018-03-30 DIAGNOSIS — E782 Mixed hyperlipidemia: Secondary | ICD-10-CM | POA: Diagnosis not present

## 2018-03-30 DIAGNOSIS — M545 Low back pain: Secondary | ICD-10-CM | POA: Diagnosis not present

## 2018-03-30 DIAGNOSIS — R51 Headache: Secondary | ICD-10-CM | POA: Diagnosis not present

## 2018-03-30 DIAGNOSIS — I10 Essential (primary) hypertension: Secondary | ICD-10-CM | POA: Diagnosis not present

## 2018-03-30 DIAGNOSIS — M9902 Segmental and somatic dysfunction of thoracic region: Secondary | ICD-10-CM | POA: Diagnosis not present

## 2018-03-30 DIAGNOSIS — M9901 Segmental and somatic dysfunction of cervical region: Secondary | ICD-10-CM | POA: Diagnosis not present

## 2018-03-30 DIAGNOSIS — Z6834 Body mass index (BMI) 34.0-34.9, adult: Secondary | ICD-10-CM | POA: Diagnosis not present

## 2018-03-30 DIAGNOSIS — M546 Pain in thoracic spine: Secondary | ICD-10-CM | POA: Diagnosis not present

## 2018-03-30 DIAGNOSIS — M542 Cervicalgia: Secondary | ICD-10-CM | POA: Diagnosis not present

## 2018-03-30 DIAGNOSIS — M797 Fibromyalgia: Secondary | ICD-10-CM | POA: Diagnosis not present

## 2018-03-30 DIAGNOSIS — M79671 Pain in right foot: Secondary | ICD-10-CM | POA: Diagnosis not present

## 2018-03-30 DIAGNOSIS — Z6833 Body mass index (BMI) 33.0-33.9, adult: Secondary | ICD-10-CM | POA: Diagnosis not present

## 2018-04-04 ENCOUNTER — Ambulatory Visit (INDEPENDENT_AMBULATORY_CARE_PROVIDER_SITE_OTHER): Payer: Medicare Other | Admitting: Allergy and Immunology

## 2018-04-04 ENCOUNTER — Encounter: Payer: Self-pay | Admitting: Allergy and Immunology

## 2018-04-04 DIAGNOSIS — J3089 Other allergic rhinitis: Secondary | ICD-10-CM | POA: Diagnosis not present

## 2018-04-04 DIAGNOSIS — L255 Unspecified contact dermatitis due to plants, except food: Secondary | ICD-10-CM | POA: Diagnosis not present

## 2018-04-04 DIAGNOSIS — H1013 Acute atopic conjunctivitis, bilateral: Secondary | ICD-10-CM

## 2018-04-04 DIAGNOSIS — Z91018 Allergy to other foods: Secondary | ICD-10-CM | POA: Diagnosis not present

## 2018-04-04 MED ORDER — EPINEPHRINE 0.3 MG/0.3ML IJ SOAJ: INTRAMUSCULAR | 1 refills | Status: DC

## 2018-04-04 MED ORDER — OLOPATADINE HCL 0.2 % OP SOLN: 1.0000 [drp] | Freq: Every day | OPHTHALMIC | 5 refills | Status: DC

## 2018-04-04 NOTE — Assessment & Plan Note (Deleted)
   The patient's history suggests contact dermatitis due to poison ivy, poison oak, or poison sumac.

## 2018-04-04 NOTE — Assessment & Plan Note (Addendum)
   Continue appropriate allergen avoidance measures, azelastine nasal spray as needed, and nasal saline lavage as needed.  For thick post nasal drainage, add guaifenesin [phone] mg (Mucinex)  twice daily as needed with adequate hydration as discussed.  If allergen avoidance measures and medications fail to adequately relieve symptoms, aeroallergen immunotherapy will be considered.

## 2018-04-04 NOTE — Progress Notes (Signed)
Follow-up Note  RE: Stacey Atkinson MRN: [id number] DOB: 03-08-1946 Date of Office Visit: 04/04/2018  Primary care provider: Celene Squibb, MD Referring provider: Celene Squibb, MD  History of present illness: Stacey Atkinson is a 72 y.o. female with allergic rhinoconjunctivitis and history of food allergy presenting today for follow-up.  She was previously seen in this clinic for her initial evaluation in January 2018.  She had an allergic reaction approximately 2 weeks ago after consuming a meal at an [employer].  She did not require epinephrine.  Her nasal and ocular allergy symptoms have been well controlled with allergen avoidance and medications.  She reports that earlier this summer she developed clusters of small vesicles which which were pruritic and her primary care physician suspected was poison ivy, poison sumac, or poison oak.  This problem has resolved without recurrence.  Assessment and plan: Perennial and seasonal allergic rhinitis  Continue appropriate allergen avoidance measures, azelastine nasal spray as needed, and nasal saline lavage as needed.  For thick post nasal drainage, add guaifenesin [phone] mg (Mucinex)  twice daily as needed with adequate hydration as discussed.  If allergen avoidance measures and medications fail to adequately relieve symptoms, aeroallergen immunotherapy will be considered.  Allergic conjunctivitis  Treatment plan as outlined above for allergic rhinitis.  Continue Pataday, one drop per eye daily as needed.  History of food allergy Skin testing was negative to peanut but borderline positive to hazelnut.  Lab work revealed borderline positive results to peanut and tree nut.  Given her history, however we will not move forward with open graded oral challenge.  Continue careful avoidance of peanuts and tree nuts and have access to epinephrine autoinjector 2 pack in case of accidental ingestion.  The patient is interested in more  information regarding oral immunotherapy for food allergies.  Information has been provided.  Contact dermatitis due to plants  The patient's history suggests contact dermatitis due to poison ivy, poison oak, or poison sumac.   Meds ordered this encounter  Medications  . EPINEPHrine 0.3 mg/0.3 mL IJ SOAJ injection    Sig: Use as directed for severe allergic reaction    Dispense:  2 Device    Refill:  1    Please dispense Mylan brand.  . Olopatadine HCl (PATADAY) 0.2 % SOLN    Sig: Place 1 drop into both eyes daily.    Dispense:  1 Bottle    Refill:  5    Physical examination: Blood pressure (!) 148/80, pulse 60, temperature 98.4 F (36.9 C), temperature source Oral, resp. rate 18, height 5\' 6"  (1.676 m), weight 219 lb 3.2 oz (99.4 kg), SpO2 97 %.  General: Alert, interactive, in no acute distress. HEENT: TMs pearly gray, turbinates moderately edematous without discharge, post-pharynx mildly erythematous. Neck: Supple without lymphadenopathy. Lungs: Clear to auscultation without wheezing, rhonchi or rales. CV: Normal S1, S2 without murmurs. Skin: Warm and dry, without lesions or rashes.  The following portions of the patient's history were reviewed and updated as appropriate: allergies, current medications, past family history, past medical history, past social history, past surgical history and problem list.  Allergies as of 04/04/2018      Reactions   Amoxicillin    Clarithromycin    Iohexol    Itching, hive, tongue swelling   Metaxalone    Tongue swelling   Other    Biorion   Oxaprozin    Unsure of reaction   Peanut-containing Drug Products    Potassium Clavulanate [  clavulanic Acid]    Unsure of reaction   Trimethoprim    Unsure of reaction   Sulfa Antibiotics Rash      Medication List        Accurate as of 04/04/18  5:34 PM. Always use your most recent med list.          ALFALFA PO Take by mouth.   b complex vitamins tablet Take 1 tablet by mouth  daily.   Black Currant Seed Oil 500 MG Caps Take by mouth.   calcium citrate-vitamin D 315-200 MG-UNIT tablet Commonly known as:  CITRACAL+D Take 1 tablet by mouth 2 (two) times daily.   dorzolamide-timolol 22.3-6.8 MG/ML ophthalmic solution Commonly known as:  COSOPT 1 drop 2 (two) times daily.   EPINEPHrine 0.3 mg/0.3 mL Soaj injection Commonly known as:  EPI-PEN Use as directed for severe allergic reaction   ezetimibe 10 MG tablet Commonly known as:  ZETIA Take 10 mg by mouth daily.   latanoprost 0.005 % ophthalmic solution Commonly known as:  XALATAN 1 drop at bedtime.   losartan 100 MG tablet Commonly known as:  COZAAR Take 100 mg by mouth daily.   Melatonin 10 MG Tabs Take by mouth.   meloxicam 7.5 MG tablet Commonly known as:  MOBIC Take 7.5 mg by mouth as needed.   metoprolol tartrate 50 MG tablet Commonly known as:  LOPRESSOR Take 50 mg by mouth daily.   Olopatadine HCl 0.2 % Soln Place 1 drop into both eyes daily.   Omega 3 1000 MG Caps Take by mouth.   Oxycodone HCl 10 MG Tabs Take 10 mg by mouth as needed.   predniSONE 50 MG tablet Commonly known as:  DELTASONE Take 50mg  orally 13 hours, 7 hours, 1 hour before scheduled CT.   timolol 0.5 % ophthalmic solution Commonly known as:  BETIMOL 1 drop 2 (two) times daily.   vitamin C 100 MG tablet Take 100 mg by mouth daily.   ZINC 15 PO Take by mouth.       Allergies  Allergen Reactions  . Amoxicillin   . Clarithromycin   . Iohexol     Itching, hive, tongue swelling  . Metaxalone     Tongue swelling  . Other     Biorion  . Oxaprozin     Unsure of reaction  . Peanut-Containing Drug Products   . Potassium Clavulanate [Clavulanic Acid]     Unsure of reaction  . Trimethoprim     Unsure of reaction  . Sulfa Antibiotics Rash   Review of systems: Review of systems negative except as noted in HPI / PMHx or noted below: Constitutional: Negative.  HENT: Negative.   Eyes: Negative.    Respiratory: Negative.   Cardiovascular: Negative.  Gastrointestinal: Negative.  Genitourinary: Negative.  Musculoskeletal: Negative.  Neurological: Negative.  Endo/Heme/Allergies: Negative.  Cutaneous: Negative.  Past Medical History:  Diagnosis Date  . Angio-edema   . Asthma    1990. no problems in years  . Eczema   . Glaucoma   . HTN (hypertension)   . Hyperlipidemia   . Urticaria     Family History  Problem Relation Age of Onset  . Allergic rhinitis Mother   . Pancreatic cancer Mother   . Asthma Sister   . Inflammatory bowel disease Neg Hx   . Colon cancer Neg Hx   . Celiac disease Neg Hx     Social History   Socioeconomic History  . Marital status: Unknown    Spouse  name: Not on file  . Number of children: Not on file  . Years of education: Not on file  . Highest education level: Not on file  Occupational History  . Not on file  Social Needs  . Financial resource strain: Not on file  . Food insecurity:    Worry: Not on file    Inability: Not on file  . Transportation needs:    Medical: Not on file    Non-medical: Not on file  Tobacco Use  . Smoking status: Never Smoker  . Smokeless tobacco: Never Used  Substance and Sexual Activity  . Alcohol use: Yes    Comment: occ  . Drug use: Never  . Sexual activity: Not on file  Lifestyle  . Physical activity:    Days per week: Not on file    Minutes per session: Not on file  . Stress: Not on file  Relationships  . Social connections:    Talks on phone: Not on file    Gets together: Not on file    Attends religious service: Not on file    Active member of club or organization: Not on file    Attends meetings of clubs or organizations: Not on file    Relationship status: Not on file  . Intimate partner violence:    Fear of current or ex partner: Not on file    Emotionally abused: Not on file    Physically abused: Not on file    Forced sexual activity: Not on file  Other Topics Concern  . Not on  file  Social History Narrative  . Not on file    I appreciate the opportunity to take part in Darnella's care. Please do not hesitate to contact me with questions.  Sincerely,   R. Edgar Frisk, MD

## 2018-04-04 NOTE — Patient Instructions (Addendum)
Perennial and seasonal allergic rhinitis  Continue appropriate allergen avoidance measures, azelastine nasal spray as needed, and nasal saline lavage as needed.  For thick post nasal drainage, add guaifenesin [phone] mg (Mucinex)  twice daily as needed with adequate hydration as discussed.  If allergen avoidance measures and medications fail to adequately relieve symptoms, aeroallergen immunotherapy will be considered.  Allergic conjunctivitis  Treatment plan as outlined above for allergic rhinitis.  Continue Pataday, one drop per eye daily as needed.  History of food allergy Skin testing was negative to peanut but borderline positive to hazelnut.  Lab work revealed borderline positive results to peanut and tree nut.  Given her history, however we will not move forward with open graded oral challenge.  Continue careful avoidance of peanuts and tree nuts and have access to epinephrine autoinjector 2 pack in case of accidental ingestion.  The patient is interested in more information regarding oral immunotherapy for food allergies.  Information has been provided.  Contact dermatitis due to plants  The patient's history suggests contact dermatitis due to poison ivy, poison oak, or poison sumac.   Return in about 6 months (around 10/03/2018), or if symptoms worsen or fail to improve.

## 2018-04-04 NOTE — Assessment & Plan Note (Signed)
   Treatment plan as outlined above for allergic rhinitis.  Continue Pataday, one drop per eye daily as needed. 

## 2018-04-04 NOTE — Assessment & Plan Note (Signed)
   The patient's history suggests contact dermatitis due to poison ivy, poison oak, or poison sumac.

## 2018-04-04 NOTE — Assessment & Plan Note (Addendum)
Skin testing was negative to peanut but borderline positive to hazelnut.  Lab work revealed borderline positive results to peanut and tree nut.  Given her history, however we will not move forward with open graded oral challenge.  Continue careful avoidance of peanuts and tree nuts and have access to epinephrine autoinjector 2 pack in case of accidental ingestion.  The patient is interested in more information regarding oral immunotherapy for food allergies.  Information has been provided.

## 2018-04-05 ENCOUNTER — Telehealth: Payer: Self-pay

## 2018-04-05 NOTE — Telephone Encounter (Signed)
Noted. Thanks.

## 2018-04-05 NOTE — Telephone Encounter (Signed)
Reviewed note and discussed with Dr. Nelva Bush. We are going to focus on younger patients for now with a max age of 33 so that we can get more experience before including the elderly with OIT.  There is an extensive risk-benefit discussion that needs to happen if she would want to proceed with this in the future. It should also be noted that she is on a beta blocker, which puts her on a higher risk of complications from anaphylaxis.   Salvatore Marvel, MD Allergy and [place] [facility]

## 2018-04-05 NOTE — Telephone Encounter (Signed)
Patient is coming in to see Dr Ernst Bowler on Monday 04/11/2018 for a food allergy consult. Patient states she doesn't know what else to do for her food allergies.

## 2018-04-05 NOTE — Telephone Encounter (Signed)
Patient is calling stating that Dr Verlin Fester told her to make an appointment with Dr Ernst Bowler because Dr Verlin Fester can not help her with the type of allergies she has. Patient states she has food allergies and would like to discuss OIT.  Please Advise on what do appointment wise for the patient.  Thanks

## 2018-04-05 NOTE — Telephone Encounter (Signed)
Gotcha. Makes sense. Thanks.

## 2018-04-11 ENCOUNTER — Encounter: Payer: Self-pay | Admitting: Allergy & Immunology

## 2018-04-11 ENCOUNTER — Ambulatory Visit (INDEPENDENT_AMBULATORY_CARE_PROVIDER_SITE_OTHER): Payer: Medicare Other | Admitting: Allergy & Immunology

## 2018-04-11 VITALS — BP 148/80 | HR 62 | Resp 16 | Ht 67.0 in | Wt 216.0 lb

## 2018-04-11 DIAGNOSIS — Z8709 Personal history of other diseases of the respiratory system: Secondary | ICD-10-CM

## 2018-04-11 DIAGNOSIS — T7800XD Anaphylactic reaction due to unspecified food, subsequent encounter: Secondary | ICD-10-CM | POA: Diagnosis not present

## 2018-04-11 NOTE — Patient Instructions (Addendum)
1. Anaphylaxis - unknown trigger - We will get some labs (blood and urine) to rule out serious causes of allergic reactions, including pheochromocytoma and carcinoid syndrome. - We will call you in 1-2 weeks with the results of the testing. - In the meantime, start Zyrtec (cetirizine) 10 mg daily to suppress your immunogenicity. - We may consider the addition of a monthly injectable medication called Xolair.   2. Return in about 3 months (around 07/11/2018).   Please inform us of any Emergency Department visits, hospitalizations, or changes in symptoms. Call us before going to the ED for breathing or allergy symptoms since we might be able to fit you in for a sick visit. Feel free to contact us anytime with any questions, problems, or concerns.  It was a pleasure to meet you today!  Websites that have reliable patient information: 1. American Academy of Asthma, Allergy, and Immunology: www.aaaai.org 2. Food Allergy Research and Education (FARE): foodallergy.org 3. Mothers of Asthmatics: http://www.asthmacommunitynetwork.org 4. American College of Allergy, Asthma, and Immunology: MonthlyElectricBill.co.uk   Make sure you are registered to vote! If you have moved or changed any of your contact information, you will need to get this updated before voting!

## 2018-04-11 NOTE — Progress Notes (Addendum)
FOLLOW UP  Date of Service/Encounter:  04/11/18   Assessment:   Anaphylactic shock due to food - reactions not completely consistent with IgE mediated reactions  Plan/Recommendations:   1. Anaphylaxis - unknown trigger - We will get some labs (blood and urine) to rule out serious causes of allergic reactions, including pheochromocytoma and carcinoid syndrome. - We will call you in 1-2 weeks with the results of the testing. - In the meantime, start Zyrtec (cetirizine) 10 mg daily to suppress your immune reactions. - We may consider the addition of a monthly injectable medication called Xolair.   2. Return in about 3 months (around 07/11/2018).   Subjective:   Stacey Atkinson is a 72 y.o. female presenting today for follow up of  Chief Complaint  Patient presents with  . Food Intolerance    diary problems     Stacey Atkinson has a history of the following: Patient Active Problem List   Diagnosis Date Noted  . Contact dermatitis due to plants 04/04/2018  . Hemorrhoids 12/20/2017  . LLQ pain 12/20/2017  . Perennial and seasonal allergic rhinitis 08/18/2016  . Allergic conjunctivitis 08/18/2016  . History of food allergy 08/18/2016  . History of asthma 08/18/2016    History obtained from: chart review and patient and the EMR.  Stacey Atkinson Primary Care Provider is Celene Squibb, MD.     Stacey Atkinson is a 72 y.o. female presenting for a follow up visit. She was previously followed by Dr. Verlin Fester, and determined that she wanted a second opinion. She was initially seen in January 2018 for an evaluation of nasal congestion, sinus problems, and food intolerances. She was also having reaction to specific fragrances as well. She underwent testing that was positive to grasses, ragweed, weeds, trees, dust mite, dog, and cat.   Her food allergy symptoms are varied. They started 20-30 years ago when she started having problems with peanuts. This seems to be a dose dependent reaction.  She can eat peanuts for one day without a problem. However, if she eats peanuts on the second day in a row, she will developed nasal congestion, lip swelling, and eyelid swelling. She had a reaction two weeks ago when she ate some [place] food that was cooked in peanut oil. She developed hives over face and the rest of her body. She had intense itching. She did not go to the ED. She took some Benadryl and black seed oil (one capsule). She started improving fairly quickly and was feeling better in 45 minutes. She reports that she has reactions to almonds with "knots" over her lips. She has had reactions to hazelnuts. She does have some abdominal pain during some of these episodes and her will occasionally have a rash. She has never had any wheezing at all and has never had any vomiting.   Since the last visit, she has developed what she thinks is a reaction to cow's milk and cheese. All in all, she has these episodes around once per week or so. The peanut was the worst one that she has had in years. She had one yesterday when she ate mac and cheese with rhinorrhea and nasal congestion. She reports that everything that she eats cause the reactions. She does "not know what is safe".   Her testing includes skin testing in January 2018 that demonstrated a positive reaction to hazelnut, but negative to peanut, soy, wheat, cow's milk, casein, egg white, shellfish mix, fish mix, cashew, pecan, walnut, almond, [place] nut, coconut, and  corn. She had peanut component testing that showed <0 levels to all components of the peanut protein. Pistachio IgE was 0.14 but other tree nuts were negative.   Otherwise, there have been no changes to her past medical history, surgical history, family history, or social history.    Review of Systems: a 14-point review of systems is pertinent for what is mentioned in HPI.  Otherwise, all other systems were negative. Constitutional: negative other than that listed in the HPI Eyes:  negative other than that listed in the HPI Ears, nose, mouth, throat, and face: negative other than that listed in the HPI Respiratory: negative other than that listed in the HPI Cardiovascular: negative other than that listed in the HPI Gastrointestinal: negative other than that listed in the HPI Genitourinary: negative other than that listed in the HPI Integument: negative other than that listed in the HPI Hematologic: negative other than that listed in the HPI Musculoskeletal: negative other than that listed in the HPI Neurological: negative other than that listed in the HPI Allergy/Immunologic: negative other than that listed in the HPI    Objective:   Blood pressure (!) 148/80, pulse 62, resp. rate 16, height 5' 7"  (1.702 m), weight 216 lb (98 kg), SpO2 97 %. Body mass index is 33.83 kg/m.   Physical Exam:  General: Alert, interactive, in no acute distress. Pleasant and talkative.  Eyes: No conjunctival injection bilaterally, no discharge on the right, no discharge on the left and no Horner-Trantas dots present. PERRL bilaterally. EOMI without pain. No photophobia.  Ears: Right TM pearly gray with normal light reflex, Left TM pearly gray with normal light reflex, Right TM intact without perforation and Left TM intact without perforation.  Nose/Throat: External nose within normal limits and nasal crease present. Turbinates edematous and pale with clear discharge. Posterior oropharynx mildly erythematous without cobblestoning in the posterior oropharynx. Tonsils 2+ without exudates.  Tongue without thrush. Lungs: Clear to auscultation without wheezing, rhonchi or rales. No increased work of breathing. CV: Normal S1/S2. No murmurs. Capillary refill <2 seconds.  Skin: Warm and dry, without lesions or rashes. Neuro:   Grossly intact. No focal deficits appreciated. Responsive to questions.  Diagnostic studies: none (labs drawn instead)        Stacey Marvel, MD  Allergy and  [facility]

## 2018-04-12 ENCOUNTER — Encounter: Payer: Self-pay | Admitting: Allergy & Immunology

## 2018-04-12 MED ORDER — CETIRIZINE HCL 10 MG PO TABS: 10.0000 mg | ORAL_TABLET | Freq: Every day | ORAL | 1 refills | Status: DC

## 2018-04-14 LAB — ALPHA-GAL PANEL
Beef (Bos spp) IgE: <0.1 kU/L (ref <0.35)
Class Interpretation: 0
Class Interpretation: 0
PORK CLASS INTERPRETATION: 0

## 2018-04-14 LAB — TRYPTASE: Tryptase: 5.1 ug/L (ref 2.2–13.2)

## 2018-04-14 LAB — SEROTONIN SERUM: Serotonin, Serum: 116 ng/mL (ref 0–420)

## 2018-04-14 LAB — MILK COMPONENT PANEL
F076-IgE Alpha Lactalbumin: <0.1 kU/L
F077-IgE Beta Lactoglobulin: <0.1 kU/L
F078-IgE Casein: <0.1 kU/L

## 2018-04-18 DIAGNOSIS — T7800XD Anaphylactic reaction due to unspecified food, subsequent encounter: Secondary | ICD-10-CM | POA: Diagnosis not present

## 2018-04-21 LAB — 5 HIAA, QUANTITATIVE, URINE, 24 HOUR
5-HIAA, Ur: 2.8 mg/L
5-HIAA,QUANT.,24 HR URINE: 6.2 mg/(24.h) (ref 0.0–14.9)

## 2018-04-21 LAB — METANEPHRINES, URINE, 24 HOUR
Metaneph Total, Ur: 64 ug/L
Metanephrines, 24H Ur: 141 ug/24 hr (ref 45–290)
Normetanephrine, 24H Ur: 343 ug/24 hr (ref 82–500)
Normetanephrine, Ur: 156 ug/L

## 2018-05-09 DIAGNOSIS — M546 Pain in thoracic spine: Secondary | ICD-10-CM | POA: Diagnosis not present

## 2018-05-09 DIAGNOSIS — M545 Low back pain: Secondary | ICD-10-CM | POA: Diagnosis not present

## 2018-05-09 DIAGNOSIS — M542 Cervicalgia: Secondary | ICD-10-CM | POA: Diagnosis not present

## 2018-05-09 DIAGNOSIS — M9902 Segmental and somatic dysfunction of thoracic region: Secondary | ICD-10-CM | POA: Diagnosis not present

## 2018-05-09 DIAGNOSIS — M9901 Segmental and somatic dysfunction of cervical region: Secondary | ICD-10-CM | POA: Diagnosis not present

## 2018-05-09 DIAGNOSIS — M9903 Segmental and somatic dysfunction of lumbar region: Secondary | ICD-10-CM | POA: Diagnosis not present

## 2018-05-10 ENCOUNTER — Emergency Department (HOSPITAL_COMMUNITY)
Admission: EM | Admit: 2018-05-10 | Discharge: 2018-05-10 | Disposition: A | Discharge status: Home / Self Care | Payer: Medicare Other | Attending: Emergency Medicine | Admitting: Emergency Medicine

## 2018-05-10 ENCOUNTER — Encounter (HOSPITAL_COMMUNITY): Payer: Self-pay | Admitting: Emergency Medicine

## 2018-05-10 ENCOUNTER — Other Ambulatory Visit: Payer: Self-pay

## 2018-05-10 ENCOUNTER — Emergency Department (HOSPITAL_COMMUNITY): Payer: Medicare Other

## 2018-05-10 DIAGNOSIS — I10 Essential (primary) hypertension: Secondary | ICD-10-CM

## 2018-05-10 DIAGNOSIS — Z79899 Other long term (current) drug therapy: Secondary | ICD-10-CM | POA: Insufficient documentation

## 2018-05-10 DIAGNOSIS — J45909 Unspecified asthma, uncomplicated: Secondary | ICD-10-CM | POA: Diagnosis not present

## 2018-05-10 DIAGNOSIS — R079 Chest pain, unspecified: Secondary | ICD-10-CM | POA: Diagnosis not present

## 2018-05-10 LAB — BASIC METABOLIC PANEL
Anion gap: 8 (ref 5–15)
BUN: 11 mg/dL (ref 8–23)
CALCIUM: 9 mg/dL (ref 8.9–10.3)
CO2: 24 mmol/L (ref 22–32)
CREATININE: 0.76 mg/dL (ref 0.44–1.00)
Chloride: 108 mmol/L (ref 98–111)
GFR calc Af Amer: >60 mL/min (ref >60)
GFR calc non Af Amer: >60 mL/min (ref >60)
GLUCOSE: 101 mg/dL — AB (ref 70–99)
Potassium: 3.9 mmol/L (ref 3.5–5.1)
Sodium: 140 mmol/L (ref 135–145)

## 2018-05-10 LAB — CBC WITH DIFFERENTIAL/PLATELET
Abs Immature Granulocytes: 0.01 10*3/uL (ref 0.00–0.07)
Basophils Absolute: 0.1 10*3/uL (ref 0.0–0.1)
Basophils Relative: 2 %
EOS ABS: 0.2 10*3/uL (ref 0.0–0.5)
EOS PCT: 5 %
HEMATOCRIT: 42.6 % (ref 36.0–46.0)
Hemoglobin: 13.6 g/dL (ref 12.0–15.0)
Immature Granulocytes: 0 %
LYMPHS ABS: 1.5 10*3/uL (ref 0.7–4.0)
Lymphocytes Relative: 36 %
MCH: 29.6 pg (ref 26.0–34.0)
MCHC: 31.9 g/dL (ref 30.0–36.0)
MCV: 92.6 fL (ref 80.0–100.0)
MONO ABS: 0.5 10*3/uL (ref 0.1–1.0)
MONOS PCT: 13 %
NRBC: 0 % (ref 0.0–0.2)
Neutro Abs: 1.9 10*3/uL (ref 1.7–7.7)
Neutrophils Relative %: 44 %
Platelets: 252 10*3/uL (ref 150–400)
RBC: 4.6 MIL/uL (ref 3.87–5.11)
RDW: 13.3 % (ref 11.5–15.5)
WBC: 4.2 10*3/uL (ref 4.0–10.5)

## 2018-05-10 LAB — HEPATIC FUNCTION PANEL
ALT: 19 U/L (ref 0–44)
AST: 18 U/L (ref 15–41)
Albumin: 4.2 g/dL (ref 3.5–5.0)
Alkaline Phosphatase: 71 U/L (ref 38–126)
BILIRUBIN INDIRECT: 0.7 mg/dL (ref 0.3–0.9)
Bilirubin, Direct: 0.1 mg/dL (ref 0.0–0.2)
Total Bilirubin: 0.8 mg/dL (ref 0.3–1.2)
Total Protein: 7.5 g/dL (ref 6.5–8.1)

## 2018-05-10 LAB — TROPONIN I

## 2018-05-10 MED ORDER — HYDRALAZINE HCL 20 MG/ML IJ SOLN
5.0000 mg | Freq: Once | INTRAMUSCULAR | Status: AC
  Administered 2018-05-10: 5 mg via INTRAVENOUS
  Filled 2018-05-10: qty 1

## 2018-05-10 NOTE — ED Triage Notes (Signed)
Pt c/o HTN, HA, and intermittent chest pain. Pt reports BP of 225/109.

## 2018-05-10 NOTE — Discharge Instructions (Addendum)
Increase your Toprol-XL so you are taking 2 pills in the morning.  Follow-up with your family doctor next week for recheck your blood pressure

## 2018-05-10 NOTE — ED Provider Notes (Signed)
[facility] EMERGENCY DEPARTMENT Provider Note   CSN: [id number] Arrival date & time: 05/10/18  1401     History   Chief Complaint Chief Complaint  Patient presents with  . Hypertension    HPI Stacey Atkinson is a 72 y.o. female.  Patient states she has had some headaches and dizziness but this is gotten better.  Her blood pressure has been higher than normal.  The history is provided by the patient. No language interpreter was used.  Hypertension  This is a chronic problem. The current episode started more than 2 days ago. The problem occurs constantly. The problem has not changed since onset.Pertinent negatives include no chest pain, no abdominal pain and no headaches. Nothing aggravates the symptoms. Nothing relieves the symptoms. She has tried nothing for the symptoms. The treatment provided no relief.    Past Medical History:  Diagnosis Date  . Angio-edema   . Asthma    1990. no problems in years  . Eczema   . Glaucoma   . HTN (hypertension)   . Hyperlipidemia   . Urticaria     Patient Active Problem List   Diagnosis Date Noted  . Contact dermatitis due to plants 04/04/2018  . Hemorrhoids 12/20/2017  . LLQ pain 12/20/2017  . Perennial and seasonal allergic rhinitis 08/18/2016  . Allergic conjunctivitis 08/18/2016  . History of food allergy 08/18/2016  . History of asthma 08/18/2016    Past Surgical History:  Procedure Laterality Date  . CESAREAN SECTION  1974/1979  . COLONOSCOPY  10/2012   Dr. Jackquline Denmark: mild sigmoid diverticulosis, int/ext hemorrhoids. otherwise normal colon and terminal ileum. next tcs in 10 years,  . EYE SURGERY Right 1963   right orbit rebuilt  . TONSILLECTOMY       OB History   None      Home Medications    Prior to Admission medications   Medication Sig Start Date End Date Taking? Authorizing Provider  b complex vitamins tablet Take 1 tablet by mouth daily.   Yes [provider]  BELSOMRA 10 MG TABS Take 10  mg by mouth at bedtime as needed (for sleep).  03/31/18  Yes [provider]  calcium-vitamin D (OSCAL WITH D) 500-200 MG-UNIT tablet Take 1 tablet by mouth 2 (two) times daily.   Yes [provider]  dorzolamide-timolol (COSOPT) 22.3-6.8 MG/ML ophthalmic solution Place 1 drop into both eyes 2 (two) times daily.    Yes [provider]  EPINEPHrine 0.3 mg/0.3 mL IJ SOAJ injection Use as directed for severe allergic reaction Patient taking differently: Inject 0.3 mg into the muscle once. Use as directed for severe allergic reaction 04/04/18  Yes Bobbitt, Sedalia Muta, MD  ezetimibe (ZETIA) 10 MG tablet Take 10 mg by mouth daily.   Yes [provider]  latanoprost (XALATAN) 0.005 % ophthalmic solution Place 1 drop into both eyes at bedtime.    Yes [provider]  losartan (COZAAR) 100 MG tablet Take 100 mg by mouth every morning.    Yes [provider]  Melatonin 10 MG TABS Take 10 mg by mouth at bedtime as needed (for sleep).    Yes [provider]  meloxicam (MOBIC) 7.5 MG tablet Take 7.5 mg by mouth daily as needed for pain ((Gout)).    Yes [provider]  metoprolol succinate (TOPROL-XL) 50 MG 24 hr tablet Take 50 mg by mouth every morning. Take with or immediately following a meal.   Yes [provider]  NON  FORMULARY Take 3 capsules by mouth daily. TOTAL RESTORE: to promote healthy gut lining   Yes [provider]  Oxycodone HCl 10 MG TABS Take 10 mg by mouth daily as needed (for pain).    Yes [provider]  PROCTOFOAM [facility] rectal foam Place 1 application rectally 2 (two) times daily. 04/19/18  Yes [provider]  vitamin C (ASCORBIC ACID) 250 MG tablet Take 250 mg by mouth daily.    Yes [provider]  zinc gluconate 50 MG tablet Take 50 mg by mouth daily.   Yes [provider]  cetirizine (ZYRTEC) 10 MG tablet Take 1 tablet (10 mg total) by mouth daily. Patient not  taking: Reported on 05/10/2018 04/12/18   Valentina Shaggy, MD  Olopatadine HCl (PATADAY) 0.2 % SOLN Place 1 drop into both eyes daily. Patient not taking: Reported on 05/10/2018 04/04/18   Bobbitt, Sedalia Muta, MD    Family History Family History  Problem Relation Age of Onset  . Allergic rhinitis Mother   . Pancreatic cancer Mother   . Asthma Sister   . Inflammatory bowel disease Neg Hx   . Colon cancer Neg Hx   . Celiac disease Neg Hx     Social History Social History   Tobacco Use  . Smoking status: Never Smoker  . Smokeless tobacco: Never Used  Substance Use Topics  . Alcohol use: Yes    Comment: occ  . Drug use: Never     Allergies   Peanut-containing drug products; Amoxicillin; Clarithromycin; Iodinated diagnostic agents; Iohexol; Levaquin [levofloxacin in d5w]; Metaxalone; Other; Oxaprozin; Potassium clavulanate [clavulanic acid]; Trimethoprim; and Sulfa antibiotics   Review of Systems Review of Systems  Constitutional: Negative for appetite change and fatigue.  HENT: Negative for congestion, ear discharge and sinus pressure.   Eyes: Negative for discharge.  Respiratory: Negative for cough.   Cardiovascular: Negative for chest pain.  Gastrointestinal: Negative for abdominal pain and diarrhea.  Genitourinary: Negative for frequency and hematuria.  Musculoskeletal: Negative for back pain.  Skin: Negative for rash.  Neurological: Positive for dizziness. Negative for seizures and headaches.  Psychiatric/Behavioral: Negative for hallucinations.     Physical Exam Updated Vital Signs BP (!) 190/71 (BP Location: Right Arm)   Pulse (!) 56   Temp 98 F (36.7 C)   Resp 19   SpO2 99%   Physical Exam  Constitutional: She is oriented to person, place, and time. She appears well-developed.  HENT:  Head: Normocephalic.  Eyes: Conjunctivae and EOM are normal. No scleral icterus.  Neck: Neck supple. No thyromegaly present.  Cardiovascular: Normal rate and  regular rhythm. Exam reveals no gallop and no friction rub.  No murmur heard. Pulmonary/Chest: No stridor. She has no wheezes. She has no rales. She exhibits no tenderness.  Abdominal: She exhibits no distension. There is no tenderness. There is no rebound.  Musculoskeletal: Normal range of motion. She exhibits no edema.  Lymphadenopathy:    She has no cervical adenopathy.  Neurological: She is oriented to person, place, and time. She exhibits normal muscle tone. Coordination normal.  Skin: No rash noted. No erythema.  Psychiatric: She has a normal mood and affect. Her behavior is normal.     ED Treatments / Results  Labs (all labs ordered are listed, but only abnormal results are displayed) Labs Reviewed  BASIC METABOLIC PANEL - Abnormal; Notable for the following components:      Result Value   Glucose, Bld 101 (*)    All other  components within normal limits  CBC WITH DIFFERENTIAL/PLATELET  TROPONIN I  HEPATIC FUNCTION PANEL    EKG None  Radiology Dg Chest 2 View  Result Date: 05/10/2018 CLINICAL DATA:  Intermittent chest pain EXAM: CHEST - 2 VIEW COMPARISON:  01/06/2017 CT chest FINDINGS: The lungs are hyperinflated likely secondary to COPD. There is no focal parenchymal opacity. There is no pleural effusion or pneumothorax. The heart and mediastinal contours are unremarkable. The osseous structures are unremarkable. IMPRESSION: No active cardiopulmonary disease. Electronically Signed   By: Kathreen Devoid   On: 05/10/2018 14:39    Procedures Procedures (including critical care time)  Medications Ordered in ED Medications  hydrALAZINE (APRESOLINE) injection 5 mg (5 mg Intravenous Given 05/10/18 1538)     Initial Impression / Assessment and Plan / ED Course  I have reviewed the triage vital signs and the nursing notes.  Pertinent labs & imaging results that were available during my care of the patient were reviewed by me and considered in my medical decision making  (see chart for details).     EKG and labs unremarkable.  Patient symptoms improved with lowering her blood pressure.  We will increase her Toprol-XL from 50 mg a day 100 mg a day and she will follow-up with her PCP  Final Clinical Impressions(s) / ED Diagnoses   Final diagnoses:  Essential hypertension    ED Discharge Orders    None       Milton Ferguson, MD 05/10/18 1627

## 2018-05-19 DIAGNOSIS — M545 Low back pain: Secondary | ICD-10-CM | POA: Diagnosis not present

## 2018-05-19 DIAGNOSIS — M546 Pain in thoracic spine: Secondary | ICD-10-CM | POA: Diagnosis not present

## 2018-05-19 DIAGNOSIS — I10 Essential (primary) hypertension: Secondary | ICD-10-CM | POA: Diagnosis not present

## 2018-05-19 DIAGNOSIS — M542 Cervicalgia: Secondary | ICD-10-CM | POA: Diagnosis not present

## 2018-05-19 DIAGNOSIS — M9902 Segmental and somatic dysfunction of thoracic region: Secondary | ICD-10-CM | POA: Diagnosis not present

## 2018-05-19 DIAGNOSIS — M9901 Segmental and somatic dysfunction of cervical region: Secondary | ICD-10-CM | POA: Diagnosis not present

## 2018-05-19 DIAGNOSIS — M9903 Segmental and somatic dysfunction of lumbar region: Secondary | ICD-10-CM | POA: Diagnosis not present

## 2018-08-18 ENCOUNTER — Ambulatory Visit (INDEPENDENT_AMBULATORY_CARE_PROVIDER_SITE_OTHER): Payer: Medicare Other | Admitting: Gastroenterology

## 2018-08-18 ENCOUNTER — Other Ambulatory Visit: Payer: Self-pay

## 2018-08-18 ENCOUNTER — Encounter: Payer: Self-pay | Admitting: Gastroenterology

## 2018-08-18 VITALS — BP 178/76 | HR 60 | Temp 97.4°F | Ht 67.0 in | Wt 218.0 lb

## 2018-08-18 DIAGNOSIS — R198 Other specified symptoms and signs involving the digestive system and abdomen: Secondary | ICD-10-CM | POA: Diagnosis not present

## 2018-08-18 DIAGNOSIS — K649 Unspecified hemorrhoids: Secondary | ICD-10-CM | POA: Diagnosis not present

## 2018-08-18 DIAGNOSIS — K6289 Other specified diseases of anus and rectum: Secondary | ICD-10-CM

## 2018-08-18 MED ORDER — PEG 3350-KCL-NA BICARB-NACL 420 G PO SOLR: 4000.0000 mL | ORAL | 0 refills | Status: DC

## 2018-08-18 NOTE — Patient Instructions (Signed)
Colonoscopy as scheduled. See separate instructions.   Continue Proctofoam for one more week.   I will call [facility] to find out how much their homemade rectal cream would cost and let you know.

## 2018-08-18 NOTE — Progress Notes (Signed)
Primary Care Physician: Celene Squibb, MD  Primary Gastroenterologist:  Barney Drain, MD   Chief Complaint  Patient presents with  . Hemorrhoids    HPI: Stacey Atkinson is a 73 y.o. female here for follow-up.  Last seen in June 2019.  History of hemorrhoids.  History of left lower quadrant pain previously felt to be related to a pinched nerve, resolved after seeing a chiropractor.  Prior to her last office visit she was treated for possible diverticulitis by PCP.  We offered CT abdomen pelvis for persisting left lower quadrant pain, this was ordered but patient never completed.  Her last colonoscopy was in 2014 by Dr. Lyndel Safe, she had mild sigmoid diverticulosis, internal/external hemorrhoids. 10 year f/u TCS recommended.   Patient presents back with ongoing rectal concerns.  She states her symptoms improved since she was here last but returned a couple of weeks ago. She feels like a big ball in her rectum and urge to have bowel movement but nothing comes.  Has persistent pressure.  She has had a lot of burning. Has tried witch hazel. She had her Proctofoam refilled but notes that it is too expensive, way over $150. She used it for 2 days this time with her flare but no improvement. She has been trying hot bath every other day.  Keep stools regular with flaxseed, veggies, fruits.  Stools are soft.  She is interested in having her hemorrhoids taken care of.   Current Outpatient Medications  Medication Sig Dispense Refill  . b complex vitamins tablet Take 1 tablet by mouth daily.    . calcium-vitamin D (OSCAL WITH D) 500-200 MG-UNIT tablet Take 1 tablet by mouth 2 (two) times daily.    . cetirizine (ZYRTEC) 10 MG tablet Take 1 tablet (10 mg total) by mouth daily. (Patient taking differently: Take 10 mg by mouth as needed. ) 90 tablet 1  . dorzolamide-timolol (COSOPT) 22.3-6.8 MG/ML ophthalmic solution Place 1 drop into both eyes 2 (two) times daily.     Marland Kitchen EPINEPHrine 0.3 mg/0.3 mL IJ SOAJ  injection Use as directed for severe allergic reaction (Patient taking differently: Inject 0.3 mg into the muscle once. Use as directed for severe allergic reaction) 2 Device 1  . ezetimibe (ZETIA) 10 MG tablet Take 10 mg by mouth daily.    Marland Kitchen latanoprost (XALATAN) 0.005 % ophthalmic solution Place 1 drop into both eyes at bedtime.     Marland Kitchen losartan (COZAAR) 100 MG tablet Take 100 mg by mouth every morning.     . Melatonin 10 MG TABS Take 10 mg by mouth at bedtime as needed (for sleep).     . meloxicam (MOBIC) 7.5 MG tablet Take 7.5 mg by mouth daily as needed for pain ((Gout)).     . metoprolol succinate (TOPROL-XL) 50 MG 24 hr tablet Take 50 mg by mouth every morning. Take with or immediately following a meal.    . NON FORMULARY Take 3 capsules by mouth daily. TOTAL RESTORE: to promote healthy gut lining    . Oxycodone HCl 10 MG TABS Take 10 mg by mouth daily as needed (for pain).     Marland Kitchen PROCTOFOAM HC rectal foam Place 1 application rectally 2 (two) times daily.  2  . vitamin C (ASCORBIC ACID) 250 MG tablet Take 250 mg by mouth daily.     Marland Kitchen zinc gluconate 50 MG tablet Take 50 mg by mouth daily.     No current facility-administered medications for this visit.  Allergies as of 08/18/2018 - Review Complete 08/18/2018  Allergen Reaction Noted  . Peanut-containing drug products Shortness Of Breath and Swelling 08/18/2016  . Amoxicillin Hives and Swelling 08/18/2016  . Clarithromycin  08/18/2016  . Iodinated diagnostic agents Hives and Swelling 05/10/2018  . Iohexol Hives, Itching, and Swelling 08/18/2016  . Levaquin [levofloxacin in d5w] Other (See Comments) 05/10/2018  . Metaxalone Swelling 12/20/2017  . Other  08/18/2016  . Oxaprozin  12/20/2017  . Potassium clavulanate [clavulanic acid]  12/20/2017  . Trimethoprim  12/20/2017  . Sulfa antibiotics Rash 12/20/2017   Past Medical History:  Diagnosis Date  . Angio-edema   . Asthma    1990. no problems in years  . Eczema   . Glaucoma     . HTN (hypertension)   . Hyperlipidemia   . Urticaria    Past Surgical History:  Procedure Laterality Date  . CESAREAN SECTION  1974/1979  . COLONOSCOPY  10/2012   Dr. Jackquline Denmark: mild sigmoid diverticulosis, int/ext hemorrhoids. otherwise normal colon and terminal ileum. next tcs in 10 years,  . EYE SURGERY Right 1963   right orbit rebuilt  . TONSILLECTOMY     Family History  Problem Relation Age of Onset  . Allergic rhinitis Mother   . Pancreatic cancer Mother   . Asthma Sister   . Inflammatory bowel disease Neg Hx   . Colon cancer Neg Hx   . Celiac disease Neg Hx    Social History   Tobacco Use  . Smoking status: Never Smoker  . Smokeless tobacco: Never Used  Substance Use Topics  . Alcohol use: Yes    Comment: occ  . Drug use: Never    ROS:  General: Negative for anorexia, weight loss, fever, chills, fatigue, weakness. ENT: Negative for hoarseness, difficulty swallowing , nasal congestion. CV: Negative for chest pain, angina, palpitations, dyspnea on exertion, peripheral edema.  Respiratory: Negative for dyspnea at rest, dyspnea on exertion, cough, sputum, wheezing.  GI: See history of present illness. GU:  Negative for dysuria, hematuria, urinary incontinence, urinary frequency, nocturnal urination.  Endo: Negative for unusual weight change.    Physical Examination:   BP (!) 178/76   Pulse 60   Temp (!) 97.4 F (36.3 C) (Oral)   Ht 5\' 7"  (1.702 m)   Wt 218 lb (98.9 kg)   BMI 34.14 kg/m   General: Well-nourished, well-developed in no acute distress.  Eyes: No icterus. Mouth: Oropharyngeal mucosa moist and pink , no lesions erythema or exudate. Lungs: Clear to auscultation bilaterally.  Heart: Regular rate and rhythm, no murmurs rubs or gallops.  Abdomen: Bowel sounds are normal, nontender, nondistended, no hepatosplenomegaly or masses, no abdominal bruits or hernia , no rebound or guarding.   Rectal exam: No stool present.  Secretions heme-negative.   Nontender exam.  No evidence of thrombosed hemorrhoids.  Large hemorrhoidal skin tag noted externally without excoriation or edema. Extremities: No lower extremity edema. No clubbing or deformities. Neuro: Alert and oriented x 4   Skin: Warm and dry, no jaundice.   Psych: Alert and cooperative, normal mood and affect.   Lab Results  Component Value Date   CREATININE 0.76 05/10/2018   BUN 11 05/10/2018   NA 140 05/10/2018   K 3.9 05/10/2018   CL 108 05/10/2018   CO2 24 05/10/2018   Lab Results  Component Value Date   WBC 4.2 05/10/2018   HGB 13.6 05/10/2018   HCT 42.6 05/10/2018   MCV 92.6 05/10/2018   PLT  252 05/10/2018     Imaging Studies: No results found.

## 2018-08-22 ENCOUNTER — Telehealth: Payer: Self-pay | Admitting: Gastroenterology

## 2018-08-22 ENCOUNTER — Telehealth: Payer: Self-pay

## 2018-08-22 DIAGNOSIS — E782 Mixed hyperlipidemia: Secondary | ICD-10-CM | POA: Diagnosis not present

## 2018-08-22 DIAGNOSIS — E785 Hyperlipidemia, unspecified: Secondary | ICD-10-CM | POA: Diagnosis not present

## 2018-08-22 DIAGNOSIS — I10 Essential (primary) hypertension: Secondary | ICD-10-CM | POA: Diagnosis not present

## 2018-08-22 DIAGNOSIS — Z6834 Body mass index (BMI) 34.0-34.9, adult: Secondary | ICD-10-CM | POA: Diagnosis not present

## 2018-08-22 DIAGNOSIS — R7301 Impaired fasting glucose: Secondary | ICD-10-CM | POA: Diagnosis not present

## 2018-08-22 NOTE — Assessment & Plan Note (Signed)
Very pleasant 73 year old female with history of internal/external hemorrhoids who presents with recurrent rectal complaints.  Complains of pressure, feeling of fullness in the rectum, urge to have a BM persistently.  Stools are soft.  Recently started Proctofoam again, too soon to notice any difference.  Unable to continue Proctofoam due to expense.  Last colonoscopy in 2014.  Given rectal complaints, would offer colonoscopy for further evaluation.  Consider banding at time of procedure if appropriate.  I have discussed the risks, alternatives, benefits with regards to but not limited to the risk of reaction to medication, bleeding, infection, perforation and the patient is agreeable to proceed. Written consent to be obtained.  We discussed potential hemorrhoid banding at time of colonoscopy versus other options such as hemorrhoidectomy.  She would like to try to avoid surgical intervention if possible.  We will send in request for compounded hemorrhoid cream to [facility] to see how much it would cost her.

## 2018-08-22 NOTE — Telephone Encounter (Signed)
Called and informed pt of pre-op appt 11/09/18 at 9:00am. Letter mailed.

## 2018-08-22 NOTE — Progress Notes (Signed)
cc'd to pcp 

## 2018-08-22 NOTE — Telephone Encounter (Signed)
Can we find out how much it would cost to have [facility] to compound a hemorrhoid cream containing hydrocortisone and lidocaine (equivalent of anamantle) ? Maybe 30 grams with 2 refills.

## 2018-08-22 NOTE — Telephone Encounter (Signed)
I spoke to [facility] and they can do it for around $40.00.

## 2018-08-23 NOTE — Telephone Encounter (Signed)
Pt said OK to order and she would like to order now.

## 2018-08-23 NOTE — Telephone Encounter (Signed)
Before we have them make it will you please let patient know how much it will be and ask her if she wants it now or wait until she is out of proctofoam?

## 2018-08-23 NOTE — Telephone Encounter (Signed)
I called the Rx to [facility]. I called pt and left Vm that it has been called in.

## 2018-08-23 NOTE — Telephone Encounter (Signed)
OK to call it in. [facility] compounded hemorrhoid cream containing hydrocortisone and lidocaine (equivalent of Anamantle), 30 grams and 2 refills.

## 2018-08-26 DIAGNOSIS — R7301 Impaired fasting glucose: Secondary | ICD-10-CM | POA: Diagnosis not present

## 2018-08-26 DIAGNOSIS — E782 Mixed hyperlipidemia: Secondary | ICD-10-CM | POA: Diagnosis not present

## 2018-08-26 DIAGNOSIS — M797 Fibromyalgia: Secondary | ICD-10-CM | POA: Diagnosis not present

## 2018-08-26 DIAGNOSIS — H4010X0 Unspecified open-angle glaucoma, stage unspecified: Secondary | ICD-10-CM | POA: Diagnosis not present

## 2018-08-26 DIAGNOSIS — I10 Essential (primary) hypertension: Secondary | ICD-10-CM | POA: Diagnosis not present

## 2018-09-01 ENCOUNTER — Telehealth: Payer: Self-pay

## 2018-09-01 NOTE — Telephone Encounter (Signed)
Pt called and said she would need to reschedule her colonoscopy that is scheduled for 11/15/2018. Her sister that was to bring her has got jury duty that day. She would like to be rescheduled and it is OK to just mail her new date and time with instructions since her phone is not working well all of the time. Forwarding to [facility] to reschedule.

## 2018-09-01 NOTE — Telephone Encounter (Signed)
Noted  

## 2018-09-01 NOTE — Telephone Encounter (Signed)
We currently do not have May schedule for [facility] to r/s. Will call once we do. Called endo and made aware

## 2018-09-14 ENCOUNTER — Other Ambulatory Visit: Payer: Self-pay | Admitting: *Deleted

## 2018-09-14 DIAGNOSIS — K649 Unspecified hemorrhoids: Secondary | ICD-10-CM

## 2018-09-14 DIAGNOSIS — K6289 Other specified diseases of anus and rectum: Secondary | ICD-10-CM

## 2018-09-14 NOTE — Telephone Encounter (Signed)
Spoke with pt and she is r/s'd to 5/26 at 10:15am. Instructions mailed. Orders reentered. Will call back with pre-op appt

## 2018-09-15 NOTE — Telephone Encounter (Signed)
Pre-op scheduled for 12/06/2018 at 10:00am. Letter mailed. LMOVM

## 2018-09-19 DIAGNOSIS — H40003 Preglaucoma, unspecified, bilateral: Secondary | ICD-10-CM | POA: Diagnosis not present

## 2018-10-03 ENCOUNTER — Ambulatory Visit: Payer: Medicare Other | Admitting: Allergy and Immunology

## 2018-10-04 DIAGNOSIS — H4010X0 Unspecified open-angle glaucoma, stage unspecified: Secondary | ICD-10-CM | POA: Diagnosis not present

## 2018-10-04 DIAGNOSIS — R7301 Impaired fasting glucose: Secondary | ICD-10-CM | POA: Diagnosis not present

## 2018-10-04 DIAGNOSIS — E782 Mixed hyperlipidemia: Secondary | ICD-10-CM | POA: Diagnosis not present

## 2018-10-04 DIAGNOSIS — I10 Essential (primary) hypertension: Secondary | ICD-10-CM | POA: Diagnosis not present

## 2018-11-09 ENCOUNTER — Other Ambulatory Visit (HOSPITAL_COMMUNITY): Payer: Medicare Other

## 2018-11-15 ENCOUNTER — Ambulatory Visit (HOSPITAL_COMMUNITY): Admit: 2018-11-15 | Payer: Medicare Other | Admitting: Gastroenterology

## 2018-11-15 ENCOUNTER — Encounter (HOSPITAL_COMMUNITY): Payer: Self-pay

## 2018-11-15 SURGERY — COLONOSCOPY WITH PROPOFOL
Anesthesia: Monitor Anesthesia Care

## 2018-11-18 DIAGNOSIS — E782 Mixed hyperlipidemia: Secondary | ICD-10-CM | POA: Diagnosis not present

## 2018-11-18 DIAGNOSIS — I10 Essential (primary) hypertension: Secondary | ICD-10-CM | POA: Diagnosis not present

## 2018-11-18 DIAGNOSIS — R7301 Impaired fasting glucose: Secondary | ICD-10-CM | POA: Diagnosis not present

## 2018-11-22 ENCOUNTER — Telehealth: Payer: Self-pay

## 2018-11-22 DIAGNOSIS — Z0001 Encounter for general adult medical examination with abnormal findings: Secondary | ICD-10-CM | POA: Diagnosis not present

## 2018-11-22 DIAGNOSIS — I10 Essential (primary) hypertension: Secondary | ICD-10-CM | POA: Diagnosis not present

## 2018-11-22 DIAGNOSIS — K1230 Oral mucositis (ulcerative), unspecified: Secondary | ICD-10-CM | POA: Diagnosis not present

## 2018-11-22 DIAGNOSIS — R1032 Left lower quadrant pain: Secondary | ICD-10-CM | POA: Diagnosis not present

## 2018-11-22 DIAGNOSIS — R300 Dysuria: Secondary | ICD-10-CM | POA: Diagnosis not present

## 2018-11-22 NOTE — Telephone Encounter (Signed)
Pt called to r/s her procedure 12/06/2018. Pt's sister that was bringing her to her procedure is out of town and she wants to push the appointment out due to [place]. [phone]

## 2018-11-22 NOTE — Telephone Encounter (Signed)
Called pt, she wanted to know if she needs to reschedule procedure d/t COVID-19. Advised pt our office will contact her if procedure needs to be rescheduled. She would like to keep procedure as scheduled for now. Will call back if she decides to reschedule.

## 2018-12-02 NOTE — Patient Instructions (Signed)
Stacey Atkinson  12/02/2018     @PREFPERIOPPHARMACY @   Your procedure is scheduled on   12/13/2018   Report to [facility] at  0900  A.M.  Call this number if you have problems the morning of surgery:  [phone]   Remember:  Follow the diet and prep instructions given to you by Dr Oneida Alar office.                       Take these medicines the morning of surgery with A SIP OF WATER  Mobic(if needed), metoprolol.    Do not wear jewelry, make-up or nail polish.  Do not wear lotions, powders, or perfumes, or deodorant.  Do not shave 48 hours prior to surgery.  Men may shave face and neck.  Do not bring valuables to the hospital.  [facility] is not responsible for any belongings or valuables.  Contacts, dentures or bridgework may not be worn into surgery.  Leave your suitcase in the car.  After surgery it may be brought to your room.  For patients admitted to the hospital, discharge time will be determined by your treatment team.  Patients discharged the day of surgery will not be allowed to drive home.   Name and phone number of your driver:   family Special instructions:  None  Please read over the following fact sheets that you were given. Anesthesia Post-op Instructions and Care and Recovery After Surgery       Colonoscopy, Adult, Care After This sheet gives you information about how to care for yourself after your procedure. Your health care provider may also give you more specific instructions. If you have problems or questions, contact your health care provider. What can I expect after the procedure? After the procedure, it is common to have:  A small amount of blood in your stool for 24 hours after the procedure.  Some gas.  Mild abdominal cramping or bloating. Follow these instructions at home: General instructions  For the first 24 hours after the procedure: ? Do not drive or use machinery. ? Do not sign important documents. ? Do not drink  alcohol. ? Do your regular daily activities at a slower pace than normal. ? Eat soft, easy-to-digest foods.  Take over-the-counter or prescription medicines only as told by your health care provider. Relieving cramping and bloating   Try walking around when you have cramps or feel bloated.  Apply heat to your abdomen as told by your health care provider. Use a heat source that your health care provider recommends, such as a moist heat pack or a heating pad. ? Place a towel between your skin and the heat source. ? Leave the heat on for 20-30 minutes. ? Remove the heat if your skin turns bright red. This is especially important if you are unable to feel pain, heat, or cold. You may have a greater risk of getting burned. Eating and drinking   Drink enough fluid to keep your urine pale yellow.  Resume your normal diet as instructed by your health care provider. Avoid heavy or fried foods that are hard to digest.  Avoid drinking alcohol for as long as instructed by your health care provider. Contact a health care provider if:  You have blood in your stool 2-3 days after the procedure. Get help right away if:  You have more than a small spotting of blood in your stool.  You pass large blood  clots in your stool.  Your abdomen is swollen.  You have nausea or vomiting.  You have a fever.  You have increasing abdominal pain that is not relieved with medicine. Summary  After the procedure, it is common to have a small amount of blood in your stool. You may also have mild abdominal cramping and bloating.  For the first 24 hours after the procedure, do not drive or use machinery, sign important documents, or drink alcohol.  Contact your health care provider if you have a lot of blood in your stool, nausea or vomiting, a fever, or increased abdominal pain. This information is not intended to replace advice given to you by your health care provider. Make sure you discuss any questions  you have with your health care provider. Document Released: 02/18/2004 Document Revised: 04/28/2017 Document Reviewed: 09/17/2015 Elsevier Interactive Patient Education  2019 [employer], Care After These instructions provide you with information about caring for yourself after your procedure. Your health care provider may also give you more specific instructions. Your treatment has been planned according to current medical practices, but problems sometimes occur. Call your health care provider if you have any problems or questions after your procedure. What can I expect after the procedure? After your procedure, you may:  Feel sleepy for several hours.  Feel clumsy and have poor balance for several hours.  Feel forgetful about what happened after the procedure.  Have poor judgment for several hours.  Feel nauseous or vomit.  Have a sore throat if you had a breathing tube during the procedure. Follow these instructions at home: For at least 24 hours after the procedure:      Have a responsible adult stay with you. It is important to have someone help care for you until you are awake and alert.  Rest as needed.  Do not: ? Participate in activities in which you could fall or become injured. ? Drive. ? Use heavy machinery. ? Drink alcohol. ? Take sleeping pills or medicines that cause drowsiness. ? Make important decisions or sign legal documents. ? Take care of children on your own. Eating and drinking  Follow the diet that is recommended by your health care provider.  If you vomit, drink water, juice, or soup when you can drink without vomiting.  Make sure you have little or no nausea before eating solid foods. General instructions  Take over-the-counter and prescription medicines only as told by your health care provider.  If you have sleep apnea, surgery and certain medicines can increase your risk for breathing problems. Follow  instructions from your health care provider about wearing your sleep device: ? Anytime you are sleeping, including during daytime naps. ? While taking prescription pain medicines, sleeping medicines, or medicines that make you drowsy.  If you smoke, do not smoke without supervision.  Keep all follow-up visits as told by your health care provider. This is important. Contact a health care provider if:  You keep feeling nauseous or you keep vomiting.  You feel light-headed.  You develop a rash.  You have a fever. Get help right away if:  You have trouble breathing. Summary  For several hours after your procedure, you may feel sleepy and have poor judgment.  Have a responsible adult stay with you for at least 24 hours or until you are awake and alert. This information is not intended to replace advice given to you by your health care provider. Make sure you discuss any questions  you have with your health care provider. Document Released: 10/27/2015 Document Revised: 02/19/2017 Document Reviewed: 10/27/2015 Elsevier Interactive Patient Education  2019 [employer].

## 2018-12-05 ENCOUNTER — Telehealth: Payer: Self-pay | Admitting: Gastroenterology

## 2018-12-05 NOTE — Telephone Encounter (Signed)
Pt is scheduled pre op and procedure with SF on 5/26. Pt wants to speak with nurse about insurance and why procedure was scheduled. Please call [phone]

## 2018-12-05 NOTE — Telephone Encounter (Signed)
noted 

## 2018-12-05 NOTE — Telephone Encounter (Signed)
Patient called back and she stated she is no longer having any rectal pain, hemorrhoids not an issue. She wants to cancel for now and also stated she does not feel comfortable having this done now either at the hospital. I have called endo and cancelled procedure. FYI to LSL

## 2018-12-05 NOTE — Telephone Encounter (Signed)
LMOVM for pt 

## 2018-12-06 ENCOUNTER — Encounter (HOSPITAL_COMMUNITY): Payer: Self-pay

## 2018-12-06 ENCOUNTER — Encounter (HOSPITAL_COMMUNITY)
Admission: RE | Admit: 2018-12-06 | Discharge: 2018-12-06 | Disposition: A | Discharge status: Home / Self Care | Payer: Medicare HMO | Source: Ambulatory Visit | Attending: Gastroenterology | Admitting: Gastroenterology

## 2018-12-13 ENCOUNTER — Other Ambulatory Visit: Payer: Self-pay

## 2018-12-13 ENCOUNTER — Encounter (HOSPITAL_COMMUNITY): Admission: RE | Payer: Self-pay | Source: Home / Self Care

## 2018-12-13 ENCOUNTER — Telehealth: Payer: Self-pay

## 2018-12-13 ENCOUNTER — Encounter: Payer: Self-pay | Admitting: Gastroenterology

## 2018-12-13 ENCOUNTER — Ambulatory Visit (INDEPENDENT_AMBULATORY_CARE_PROVIDER_SITE_OTHER): Payer: Medicare HMO | Admitting: Gastroenterology

## 2018-12-13 ENCOUNTER — Ambulatory Visit (HOSPITAL_COMMUNITY): Admission: RE | Admit: 2018-12-13 | Payer: Medicare HMO | Source: Home / Self Care | Admitting: Gastroenterology

## 2018-12-13 DIAGNOSIS — R1032 Left lower quadrant pain: Secondary | ICD-10-CM

## 2018-12-13 SURGERY — COLONOSCOPY WITH PROPOFOL
Anesthesia: Monitor Anesthesia Care

## 2018-12-13 MED ORDER — PREDNISONE 50 MG PO TABS: ORAL_TABLET | ORAL | 0 refills | Status: DC

## 2018-12-13 NOTE — Progress Notes (Signed)
Primary Care Physician:  Celene Squibb, MD Primary GI:  Barney Drain, MD   Patient Location: Home  Provider Location: [facility] office  Reason for Visit: LLQ pain, hemorrhoids  Persons present on the virtual encounter, with roles: Patient, myself (provider),mindy silva, cma(updated meds and allergies)  Total time (minutes) spent on medical discussion: 25 minutes  Due to COVID-19, visit was conducted using [email or web address] method.  Visit was requested by patient.  Virtual Visit via [email or web address]  I connected with Stacey Atkinson on 12/13/18 at 10:00 AM EDT by [email or web address] and verified that I am speaking with the correct person using two identifiers.   I discussed the limitations, risks, security and privacy concerns of performing an evaluation and management service by telephone/video and the availability of in person appointments. I also discussed with the patient that there may be a patient responsible charge related to this service. The patient expressed understanding and agreed to proceed.  Chief Complaint  Patient presents with  . Abdominal Pain    left side (4-5 inches in per pt), not any worse since last OV, never had CT done d/t death in family. PCP felt she was not drinking enough water and she started drinking electrolytes and it helps some     HPI:   Stacey Atkinson is a 73 y.o. female who presents for virtual visit regarding LLQ pain, hemorrhoids.   Patient was last seen in January 2020 when she presented with complaints of rectal concerns.  Feeling of a big ball in the rectum and urge to have a bowel movement but nothing comes.  Persistent pressure.  Lots of burning.  Stools regular with flaxseed, veggies, fruit.  Had utilized witch hazel.  Proctofoam helped but too expensive.  Wanted treatment of her hemorrhoids.  We will be sending [facility] will make rectal cream for her.  We have also seen her in the past for chronic left lower quadrant pain.  Also saw her chiropractor he  felt like it may be related to a pinched nerve.  She never completed CT scan ordered in June 2019.  Patient canceled her recent colonoscopy in the setting of COVID-19 pandemic.  Her last colonoscopy was in 2014 by Dr. Therisa [facility]) in [place], [place] at [facility].  Was on a 10-year surveillance plan.  Patient, recently to schedule this office visit for recurrent left lower quadrant pain.  She also discussed findings with her PCP.  Encouraged increasing water intake with electrolytes which seemed to help somewhat but symptoms are persisting.  Denies any significant dysuria or urinary issues.  No blood in the urine.  No blood in stool.  Bowel movements continue to be regular and soft.  Left lower quadrant pain bad enough to keep her up all night for 2 nights.  No fever.  No further rectal concerns except for it with heavy lifting her hemorrhoids flare.  Uses rectal compound just as needed.  No longer desires colonoscopy because her rectal issues have resolved.  Son had MI on Sunday.  He is in [place].  She has had issues with her hives which she feels is brought on by nerves.  Using Benadryl with relief.  Patient would like to go ahead and reschedule her CT abdomen pelvis.  She had a CT remotely when she was living in [place], states she had a reaction to the contrast.  She remembers having itching and hives.  She denies any shortness of breath.  She is not aware  of any tongue swelling although this is listed under her allergies.  We have been approved to have premedication with Benadryl and prednisone prior to CT but she had to cancel her CT due to family member death.    Current Outpatient Medications  Medication Sig Dispense Refill  . b complex vitamins tablet Take 1 tablet by mouth daily. (when she remembers)    . Black Currant Seed Oil 500 MG CAPS Take 1 capsule by mouth daily as needed (ALLERGIES).    . calcium-vitamin D (OSCAL WITH D) 500-200 MG-UNIT tablet Take 1 tablet  by mouth 2 (two) times daily. (when she remembers)    . Cetirizine HCl 10 MG CAPS Take 10 mg by mouth daily as needed.    . dorzolamide-timolol (COSOPT) 22.3-6.8 MG/ML ophthalmic solution Place 1 drop into both eyes 2 (two) times daily.     Marland Kitchen ezetimibe (ZETIA) 10 MG tablet Take 10 mg by mouth daily.    . hydrochlorothiazide (HYDRODIURIL) 50 MG tablet Take 25 mg by mouth daily.    Marland Kitchen latanoprost (XALATAN) 0.005 % ophthalmic solution Place 1 drop into both eyes at bedtime.     Marland Kitchen losartan (COZAAR) 100 MG tablet Take 100 mg by mouth every morning.     . Melatonin 10 MG TABS Take 10 mg by mouth at bedtime as needed (for sleep).     . meloxicam (MOBIC) 7.5 MG tablet Take 7.5 mg by mouth daily as needed for pain ((Gout)).     . metoprolol succinate (TOPROL-XL) 100 MG 24 hr tablet Take 100 mg by mouth every morning. Take with or immediately following a meal.     . NON FORMULARY Take 3 capsules by mouth daily. TOTAL RESTORE: to promote healthy gut lining  (when she remembers)    . Oil of Oregano 1500 MG CAPS Take 1 capsule by mouth daily as needed (ALLERGIES).    Ernestine Conrad 3-6-9 Fatty Acids (OMEGA-3-6-9 PO) Take 1 capsule by mouth daily.    . Oxycodone HCl 10 MG TABS Take 10 mg by mouth daily as needed (for pain).     . Probiotic CAPS Take 1 capsule by mouth daily. (when she remembers)    . PROCTOFOAM HC rectal foam Place 1 application rectally 2 (two) times daily as needed for hemorrhoids.   2  . vitamin C (ASCORBIC ACID) 250 MG tablet Take 250 mg by mouth daily. (not all the time)    . zinc gluconate 50 MG tablet Take 50 mg by mouth daily. (not all the time)     No current facility-administered medications for this visit.     Past Medical History:  Diagnosis Date  . Angio-edema   . Asthma    1990. no problems in years  . Eczema   . Glaucoma   . HTN (hypertension)   . Hyperlipidemia   . Urticaria     Past Surgical History:  Procedure Laterality Date  . CESAREAN SECTION  1974/1979  .  COLONOSCOPY  10/2012   Dr. Jackquline Denmark: mild sigmoid diverticulosis, int/ext hemorrhoids. otherwise normal colon and terminal ileum. next tcs in 10 years,  . EYE SURGERY Right 1963   right orbit rebuilt  . TONSILLECTOMY      Family History  Problem Relation Age of Onset  . Allergic rhinitis Mother   . Pancreatic cancer Mother   . Asthma Sister   . Inflammatory bowel disease Neg Hx   . Colon cancer Neg Hx   . Celiac disease Neg Hx  Social History   Socioeconomic History  . Marital status: Divorced    Spouse name: Not on file  . Number of children: Not on file  . Years of education: Not on file  . Highest education level: Not on file  Occupational History  . Not on file  Social Needs  . Financial resource strain: Not on file  . Food insecurity:    Worry: Not on file    Inability: Not on file  . Transportation needs:    Medical: Not on file    Non-medical: Not on file  Tobacco Use  . Smoking status: Never Smoker  . Smokeless tobacco: Never Used  Substance and Sexual Activity  . Alcohol use: Yes    Comment: occ  . Drug use: Never  . Sexual activity: Not on file  Lifestyle  . Physical activity:    Days per week: Not on file    Minutes per session: Not on file  . Stress: Not on file  Relationships  . Social connections:    Talks on phone: Not on file    Gets together: Not on file    Attends religious service: Not on file    Active member of club or organization: Not on file    Attends meetings of clubs or organizations: Not on file    Relationship status: Not on file  . Intimate partner violence:    Fear of current or ex partner: Not on file    Emotionally abused: Not on file    Physically abused: Not on file    Forced sexual activity: Not on file  Other Topics Concern  . Not on file  Social History Narrative  . Not on file      ROS:  General: Negative for anorexia, weight loss, fever, chills, fatigue, weakness. Eyes: Negative for vision changes.   ENT: Negative for hoarseness, difficulty swallowing , nasal congestion. CV: Negative for chest pain, angina, palpitations, dyspnea on exertion, peripheral edema.  Respiratory: Negative for dyspnea at rest, dyspnea on exertion, cough, sputum, wheezing.  GI: See history of present illness. GU:  Negative for dysuria, hematuria, urinary incontinence, urinary frequency, nocturnal urination.  MS: Negative for joint pain, low back pain.  Derm: Negative for rash or itching.  Neuro: Negative for weakness, abnormal sensation, seizure, frequent headaches, memory loss, confusion.  Psych: Negative for anxiety, depression, suicidal ideation, hallucinations.  Endo: Negative for unusual weight change.  Heme: Negative for bruising or bleeding. Allergy: Negative for rash or hives.   Observations/Objective: Pleasant well-nourished well-developed, no acute distress.  Lab Results  Component Value Date   CREATININE 0.76 05/10/2018   BUN 11 05/10/2018   NA 140 05/10/2018   K 3.9 05/10/2018   CL 108 05/10/2018   CO2 24 05/10/2018   Lab Results  Component Value Date   ALT 19 05/10/2018   AST 18 05/10/2018   ALKPHOS 71 05/10/2018   BILITOT 0.8 05/10/2018   Lab Results  Component Value Date   WBC 4.2 05/10/2018   HGB 13.6 05/10/2018   HCT 42.6 05/10/2018   MCV 92.6 05/10/2018   PLT 252 05/10/2018     Assessment and Plan: Pleasant 73 year old female presenting with recurrent left lower quadrant pain as outlined above.  Fortunately her rectal issues are much improved.  She is no longer interested in colonoscopy and would like to wait until 2024 for 10-year follow-up.  With regards to her left lower quadrant pain, this is been persistent.  Last several days.  She has had intermittent issues over the past few years but had not complete evaluation last year when ordered CT scan.  This point we need to exclude diverticulitis or underlying malignancy.  Patient recalls having reaction to CT contrast years  ago, was given IV Benadryl with good relief and was able to continue/complete CT scan.  Previously discussed with radiology and was advised to premedicate with Benadryl 50 mg 1 hour before and prednisone 50 mg 13, 7, 1 hour before CT.  Follow Up Instructions:    I discussed the assessment and treatment plan with the patient. The patient was provided an opportunity to ask questions and all were answered. The patient agreed with the plan and demonstrated an understanding of the instructions. AVS mailed to patient's home address.   The patient was advised to call back or seek an in-person evaluation if the symptoms worsen or if the condition fails to improve as anticipated.  I provided 25 minutes of virtual face-to-face time during this encounter.   Neil Crouch, PA-C

## 2018-12-13 NOTE — Telephone Encounter (Signed)
Pt has [employer]. CT scheduled for 12/29/18 at 11:00am, arrive at 10:45am. NPO 4 hours prior to test. Pick up contrast prior to test. Have lab work prior to test. Called pt back and informed her of appt and details. Letter mailed.

## 2018-12-13 NOTE — Telephone Encounter (Signed)
PA for CT submitted via [employer] website. Case pending. [facility] Tracking Number: [id number]. Clinical notes faxed to [facility].

## 2018-12-13 NOTE — Telephone Encounter (Signed)
Tried to call pt to confirm insurance prior to scheduling CT abd/pelvis w/contrast, no answer, LMOVM for return call.

## 2018-12-13 NOTE — Telephone Encounter (Signed)
BUN/creat order faxed to [facility] lab.

## 2018-12-13 NOTE — Patient Instructions (Signed)
1. CT scan plan to evaluate your left-sided abdominal pain.  We will contact you with further recommendations. 2. I have sent in a new prescription for prednisone, you will take 50 mg 13 hours before, 7 hours before, 1 hour before your scheduled CT scan to prevent allergic reaction to medication. 3. You will also need to take Benadryl 50 mg 1 hour prior to your CT scan to prevent allergic reaction to medication.

## 2018-12-14 NOTE — Telephone Encounter (Signed)
CT approved. PA# [id number], valid 12/29/18-01/28/19.

## 2018-12-19 IMAGING — CT CT CHEST LUNG CANCER SCREENING LOW DOSE W/O CM
2 of 4 series · 15 of 40 positions shown, 18 images · non-contrast
Comparison: None.

CLINICAL DATA: Thirty-nine pack-year history. Former smoker.
Asymptomatic.

EXAM:
CT CHEST WITHOUT CONTRAST LOW-DOSE FOR LUNG CANCER SCREENING
TECHNIQUE: Multidetector CT imaging of the chest was performed following the
standard protocol without IV contrast.

[Series 2: axial st · axial · 0.68mm/px · z∈[+1072,+1357]mm · 12 of 69 slices shown, 15 images]
[im 6/69  mediastinal]
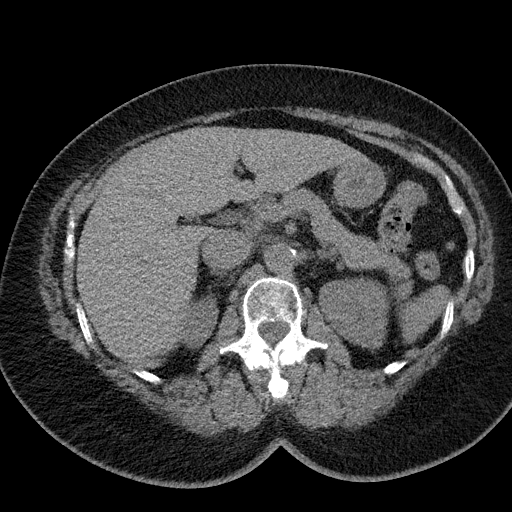
[im 6/69  lung]
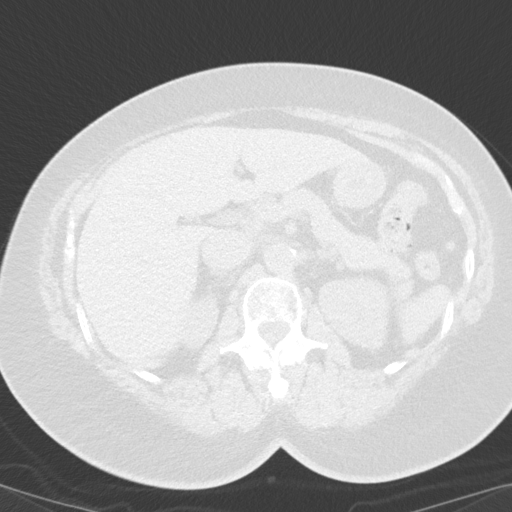
[im 11/69  lung]
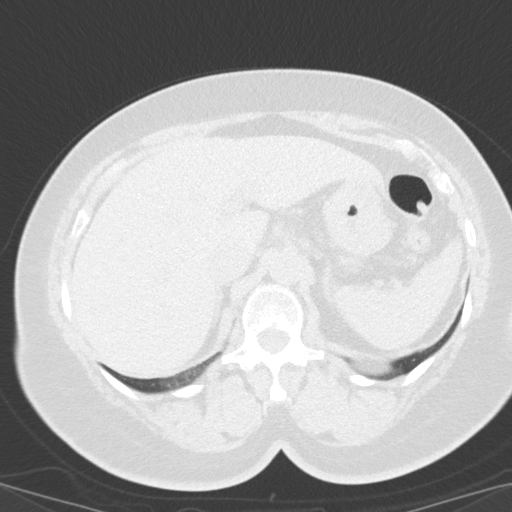
[im 16/69  lung]
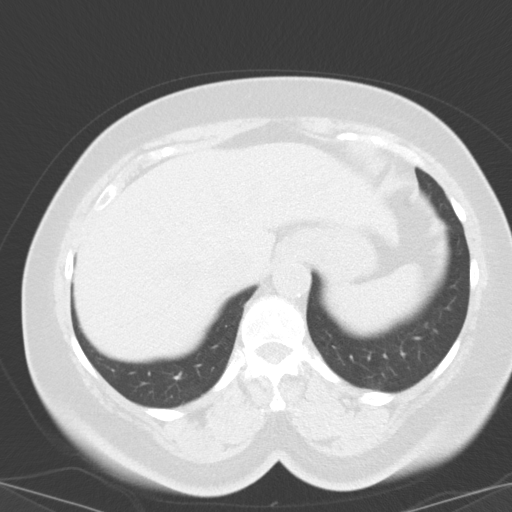
[im 21/69  lung]
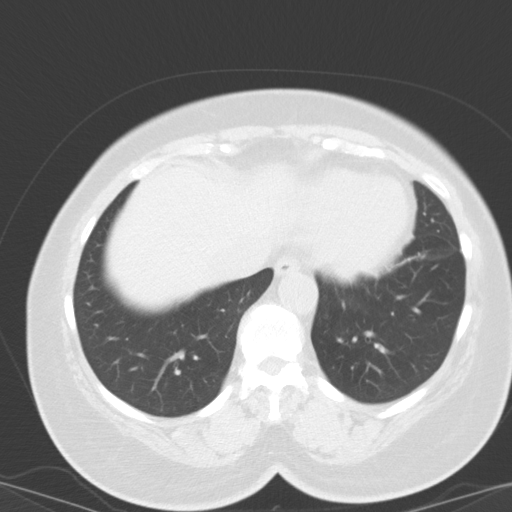
[im 27/69  mediastinal]
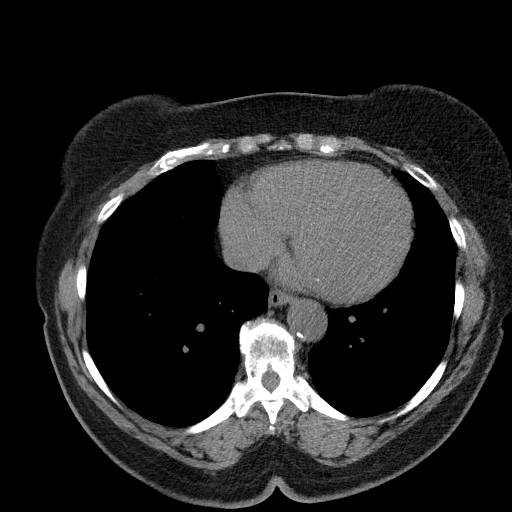
[im 27/69  lung]
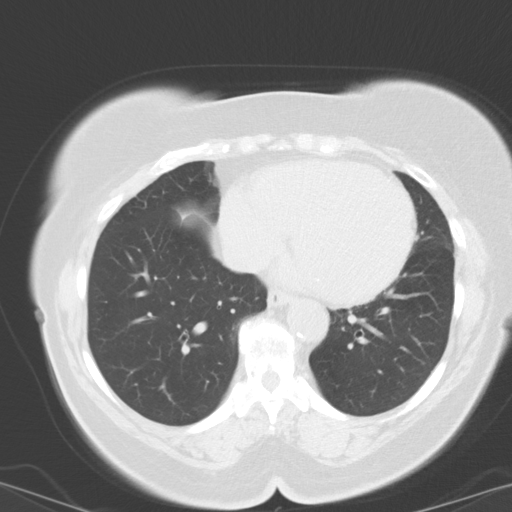
[im 32/69  lung]
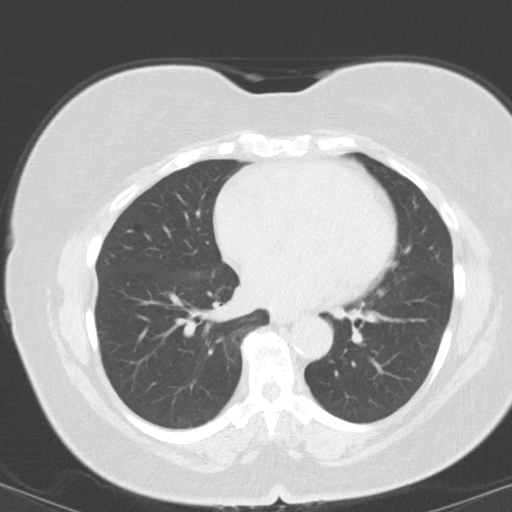
[im 37/69  lung]
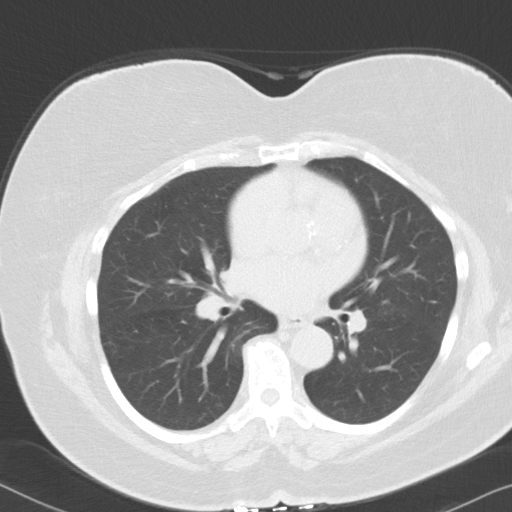
[im 42/69  lung]
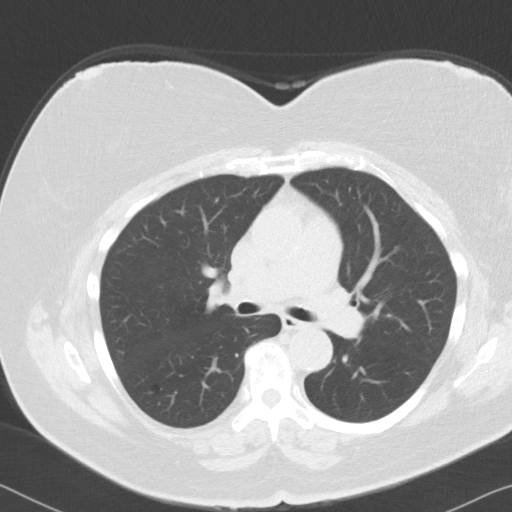
[im 48/69  mediastinal]
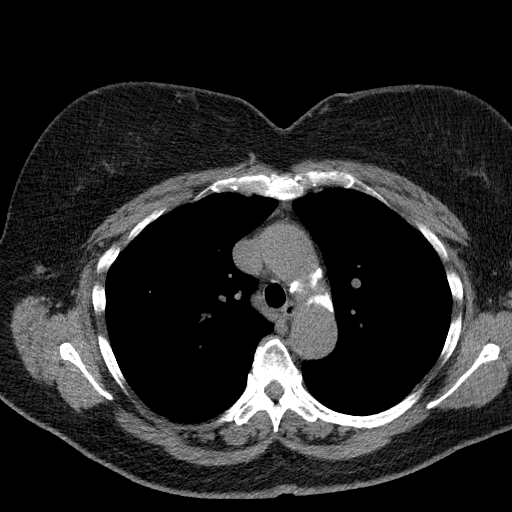
[im 48/69  lung]
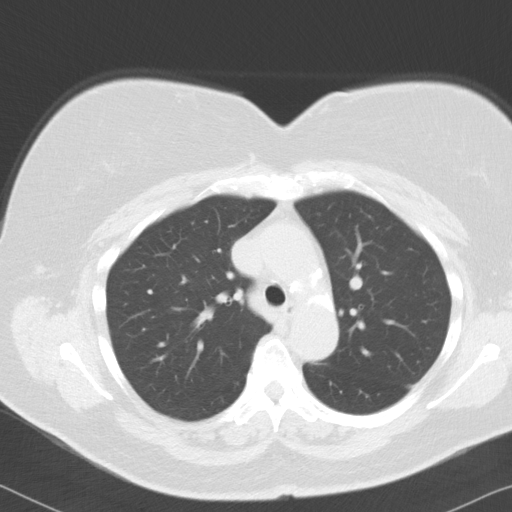
[im 53/69  lung]
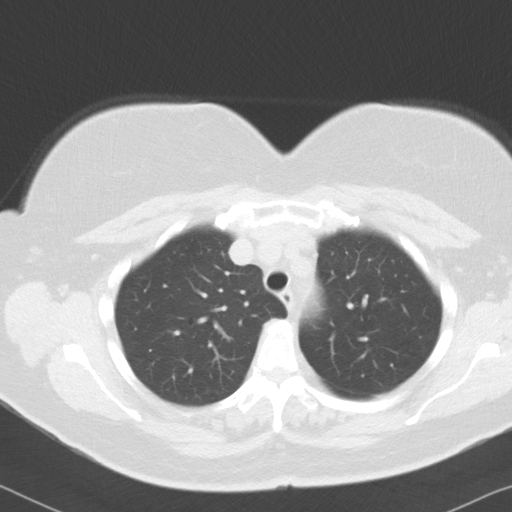
[im 58/69  lung]
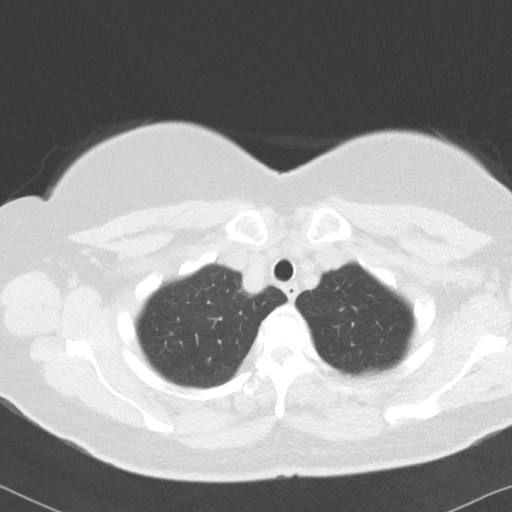
[im 63/69  lung]
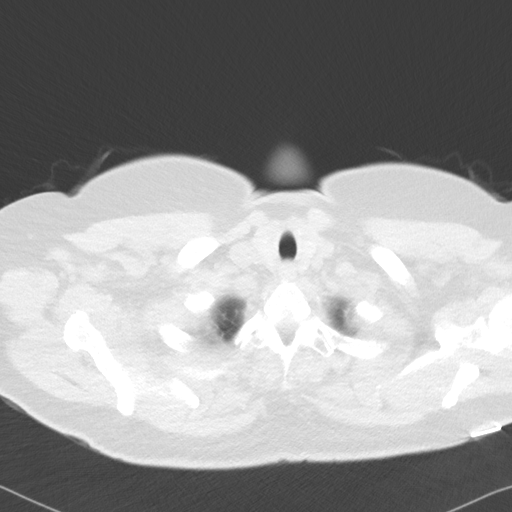

[Series 6: coronal · coronal · 0.69mm/px · 3 of 262 slices shown]
[im 53/262  lung]
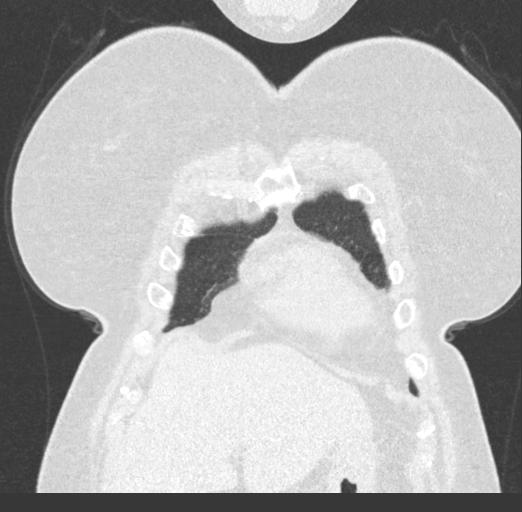
[im 105/262  lung]
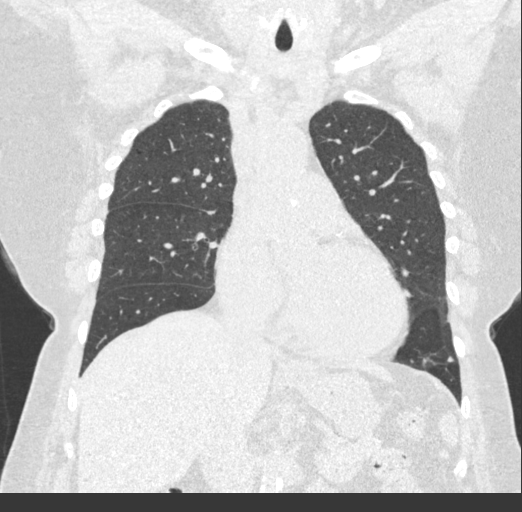
[im 157/262  lung]
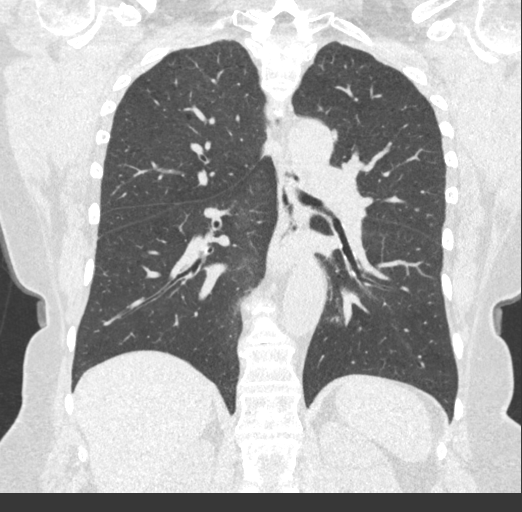

[15 of 40 positions shown; findings below may reference images not displayed]

FINDINGS: Cardiovascular: Normal heart size. No pericardial effusion. Aortic
atherosclerosis noted. Calcification in the LAD coronary artery
noted.

Mediastinum/Nodes: No pleural effusion. The trachea appears patent
and is midline. Normal appearance of the esophagus. No mediastinal
or hilar lymph nodes.

Lungs/Pleura: No pleural effusion. Mild changes of centrilobular
emphysema. A few small scattered pulmonary nodules are identified.
The largest nodule is in the perifissural right upper lobe with an
equivalent diameter 5.2 mm.

Upper Abdomen: No acute abnormality.

Musculoskeletal: No aggressive lytic or sclerotic bone lesion.
IMPRESSION: 1. Lung-RADS 2, benign appearance or behavior. Continue annual
screening with low-dose chest CT without contrast in 12 months.
2. Aortic Atherosclerosis (P83S3-BVR.R) and Emphysema (P83S3-NIR.5).

## 2018-12-27 ENCOUNTER — Telehealth: Payer: Self-pay | Admitting: Gastroenterology

## 2018-12-27 ENCOUNTER — Other Ambulatory Visit (HOSPITAL_COMMUNITY)
Admission: RE | Admit: 2018-12-27 | Discharge: 2018-12-27 | Disposition: A | Discharge status: Home / Self Care | Payer: Medicare HMO | Source: Ambulatory Visit | Attending: Gastroenterology | Admitting: Gastroenterology

## 2018-12-27 DIAGNOSIS — R1032 Left lower quadrant pain: Secondary | ICD-10-CM | POA: Diagnosis not present

## 2018-12-27 LAB — BUN: BUN: 15 mg/dL (ref 8–23)

## 2018-12-27 LAB — CREATININE, SERUM
Creatinine, Ser: 0.88 mg/dL (ref 0.44–1.00)
GFR calc Af Amer: >60 mL/min (ref >60)
GFR calc non Af Amer: >60 mL/min (ref >60)

## 2018-12-27 NOTE — Telephone Encounter (Signed)
Patient called back. She wanted to know if she has to be fasting for the blood work. I advised her she did not have to be fasting

## 2018-12-27 NOTE — Telephone Encounter (Signed)
LMTCB

## 2018-12-27 NOTE — Telephone Encounter (Signed)
[phone] PATIENT HAS QUESTION ABOUT WHERE TO GET HER LABS DONE, NEEDS TO KNOW WHERE TO GO Thursday, IS IT DONE IN [place]?

## 2018-12-29 ENCOUNTER — Ambulatory Visit (HOSPITAL_COMMUNITY)
Admission: RE | Admit: 2018-12-29 | Discharge: 2018-12-29 | Disposition: A | Discharge status: Home / Self Care | Payer: Medicare HMO | Source: Ambulatory Visit | Attending: Gastroenterology | Admitting: Gastroenterology

## 2018-12-29 ENCOUNTER — Other Ambulatory Visit: Payer: Self-pay

## 2018-12-29 DIAGNOSIS — R194 Change in bowel habit: Secondary | ICD-10-CM | POA: Diagnosis not present

## 2018-12-29 DIAGNOSIS — R1032 Left lower quadrant pain: Secondary | ICD-10-CM | POA: Diagnosis not present

## 2018-12-29 DIAGNOSIS — I700 Atherosclerosis of aorta: Secondary | ICD-10-CM | POA: Diagnosis not present

## 2018-12-29 DIAGNOSIS — D259 Leiomyoma of uterus, unspecified: Secondary | ICD-10-CM | POA: Diagnosis not present

## 2018-12-29 MED ORDER — IOHEXOL 300 MG/ML  SOLN
100.0000 mL | Freq: Once | INTRAMUSCULAR | Status: AC | PRN
  Administered 2018-12-29: 14:00:00 100 mL via INTRAVENOUS

## 2019-01-05 NOTE — Progress Notes (Signed)
LMOM to call.

## 2019-01-06 DIAGNOSIS — Z1231 Encounter for screening mammogram for malignant neoplasm of breast: Secondary | ICD-10-CM | POA: Diagnosis not present

## 2019-01-06 DIAGNOSIS — Z803 Family history of malignant neoplasm of breast: Secondary | ICD-10-CM | POA: Diagnosis not present

## 2019-01-06 NOTE — Progress Notes (Signed)
PT is aware. Said she is doing much better.  She will call if she needs an apt.

## 2019-01-06 NOTE — Progress Notes (Signed)
LMOM for a return call.  

## 2019-02-13 DIAGNOSIS — E782 Mixed hyperlipidemia: Secondary | ICD-10-CM | POA: Diagnosis not present

## 2019-02-13 DIAGNOSIS — I10 Essential (primary) hypertension: Secondary | ICD-10-CM | POA: Diagnosis not present

## 2019-02-13 DIAGNOSIS — R7301 Impaired fasting glucose: Secondary | ICD-10-CM | POA: Diagnosis not present

## 2019-02-13 DIAGNOSIS — E785 Hyperlipidemia, unspecified: Secondary | ICD-10-CM | POA: Diagnosis not present

## 2019-02-13 DIAGNOSIS — G720 Drug-induced myopathy: Secondary | ICD-10-CM | POA: Diagnosis not present

## 2019-02-27 DIAGNOSIS — R109 Unspecified abdominal pain: Secondary | ICD-10-CM | POA: Diagnosis not present

## 2019-02-27 DIAGNOSIS — E782 Mixed hyperlipidemia: Secondary | ICD-10-CM | POA: Diagnosis not present

## 2019-02-27 DIAGNOSIS — M542 Cervicalgia: Secondary | ICD-10-CM | POA: Diagnosis not present

## 2019-02-27 DIAGNOSIS — M797 Fibromyalgia: Secondary | ICD-10-CM | POA: Diagnosis not present

## 2019-02-27 DIAGNOSIS — M545 Low back pain: Secondary | ICD-10-CM | POA: Diagnosis not present

## 2019-02-27 DIAGNOSIS — R7301 Impaired fasting glucose: Secondary | ICD-10-CM | POA: Diagnosis not present

## 2019-02-27 DIAGNOSIS — K649 Unspecified hemorrhoids: Secondary | ICD-10-CM | POA: Diagnosis not present

## 2019-02-27 DIAGNOSIS — I10 Essential (primary) hypertension: Secondary | ICD-10-CM | POA: Diagnosis not present

## 2019-02-27 DIAGNOSIS — H4010X0 Unspecified open-angle glaucoma, stage unspecified: Secondary | ICD-10-CM | POA: Diagnosis not present

## 2019-03-13 DIAGNOSIS — E782 Mixed hyperlipidemia: Secondary | ICD-10-CM | POA: Diagnosis not present

## 2019-03-13 DIAGNOSIS — I10 Essential (primary) hypertension: Secondary | ICD-10-CM | POA: Diagnosis not present

## 2019-03-13 DIAGNOSIS — R7301 Impaired fasting glucose: Secondary | ICD-10-CM | POA: Diagnosis not present

## 2019-03-13 DIAGNOSIS — G720 Drug-induced myopathy: Secondary | ICD-10-CM | POA: Diagnosis not present

## 2019-03-24 DIAGNOSIS — G720 Drug-induced myopathy: Secondary | ICD-10-CM | POA: Diagnosis not present

## 2019-03-24 DIAGNOSIS — I10 Essential (primary) hypertension: Secondary | ICD-10-CM | POA: Diagnosis not present

## 2019-03-24 DIAGNOSIS — R7301 Impaired fasting glucose: Secondary | ICD-10-CM | POA: Diagnosis not present

## 2019-03-24 DIAGNOSIS — E782 Mixed hyperlipidemia: Secondary | ICD-10-CM | POA: Diagnosis not present

## 2019-04-03 DIAGNOSIS — M9901 Segmental and somatic dysfunction of cervical region: Secondary | ICD-10-CM | POA: Diagnosis not present

## 2019-04-03 DIAGNOSIS — M9902 Segmental and somatic dysfunction of thoracic region: Secondary | ICD-10-CM | POA: Diagnosis not present

## 2019-04-03 DIAGNOSIS — M546 Pain in thoracic spine: Secondary | ICD-10-CM | POA: Diagnosis not present

## 2019-04-03 DIAGNOSIS — M542 Cervicalgia: Secondary | ICD-10-CM | POA: Diagnosis not present

## 2019-04-03 DIAGNOSIS — M545 Low back pain: Secondary | ICD-10-CM | POA: Diagnosis not present

## 2019-04-03 DIAGNOSIS — M9903 Segmental and somatic dysfunction of lumbar region: Secondary | ICD-10-CM | POA: Diagnosis not present

## 2019-04-19 DIAGNOSIS — H40003 Preglaucoma, unspecified, bilateral: Secondary | ICD-10-CM | POA: Diagnosis not present

## 2019-05-31 DIAGNOSIS — G720 Drug-induced myopathy: Secondary | ICD-10-CM | POA: Diagnosis not present

## 2019-05-31 DIAGNOSIS — R7301 Impaired fasting glucose: Secondary | ICD-10-CM | POA: Diagnosis not present

## 2019-05-31 DIAGNOSIS — I10 Essential (primary) hypertension: Secondary | ICD-10-CM | POA: Diagnosis not present

## 2019-05-31 DIAGNOSIS — E782 Mixed hyperlipidemia: Secondary | ICD-10-CM | POA: Diagnosis not present

## 2019-06-20 ENCOUNTER — Other Ambulatory Visit: Payer: Self-pay

## 2019-06-20 NOTE — Telephone Encounter (Signed)
Received fax from [facility] requesting refill for Hydrocortisone 2.5%/Lidocaine 5% apply rectally 4 times daily as needed.

## 2019-06-21 NOTE — Telephone Encounter (Signed)
Pt calling to fu on RX.  [phone]

## 2019-06-21 NOTE — Telephone Encounter (Signed)
Fax completed and faxed back to pharmacy (by JL). Please notify the patient.

## 2019-06-22 NOTE — Telephone Encounter (Signed)
Called pt and she is aware that her RX was sent to the pharmacy.

## 2019-07-24 DIAGNOSIS — M25552 Pain in left hip: Secondary | ICD-10-CM | POA: Diagnosis not present

## 2019-07-24 DIAGNOSIS — I10 Essential (primary) hypertension: Secondary | ICD-10-CM | POA: Diagnosis not present

## 2019-07-31 ENCOUNTER — Other Ambulatory Visit: Payer: Self-pay | Admitting: Orthopedic Surgery

## 2019-07-31 DIAGNOSIS — M545 Low back pain, unspecified: Secondary | ICD-10-CM

## 2019-07-31 DIAGNOSIS — M25552 Pain in left hip: Secondary | ICD-10-CM | POA: Diagnosis not present

## 2019-08-03 DIAGNOSIS — R7301 Impaired fasting glucose: Secondary | ICD-10-CM | POA: Diagnosis not present

## 2019-08-03 DIAGNOSIS — E782 Mixed hyperlipidemia: Secondary | ICD-10-CM | POA: Diagnosis not present

## 2019-08-03 DIAGNOSIS — I10 Essential (primary) hypertension: Secondary | ICD-10-CM | POA: Diagnosis not present

## 2019-08-16 ENCOUNTER — Other Ambulatory Visit: Payer: Medicare HMO

## 2019-08-30 ENCOUNTER — Ambulatory Visit
Admission: RE | Admit: 2019-08-30 | Discharge: 2019-08-30 | Disposition: A | Discharge status: Home / Self Care | Payer: Medicare HMO | Source: Ambulatory Visit | Attending: Orthopedic Surgery | Admitting: Orthopedic Surgery

## 2019-08-30 DIAGNOSIS — M545 Low back pain, unspecified: Secondary | ICD-10-CM

## 2019-08-30 DIAGNOSIS — M5126 Other intervertebral disc displacement, lumbar region: Secondary | ICD-10-CM | POA: Diagnosis not present

## 2019-09-14 NOTE — Telephone Encounter (Signed)
Patient called in stating she would like to have her Rx changed from a cream to a suppository.  It's hard for her to squeeze out the cream due to her Fibromyalgia.   Please send Rx to [facility].

## 2019-09-19 ENCOUNTER — Other Ambulatory Visit: Payer: Self-pay | Admitting: Gastroenterology

## 2019-09-19 DIAGNOSIS — K649 Unspecified hemorrhoids: Secondary | ICD-10-CM

## 2019-09-19 MED ORDER — HYDROCORTISONE ACETATE 25 MG RE SUPP: 25.0000 mg | Freq: Two times a day (BID) | RECTAL | 1 refills | Status: AC | PRN

## 2019-09-19 NOTE — Telephone Encounter (Signed)
Patient made aware.

## 2019-09-19 NOTE — Telephone Encounter (Signed)
Rx for [facility] Suppository sent to [facility].

## 2019-09-21 DIAGNOSIS — Z0000 Encounter for general adult medical examination without abnormal findings: Secondary | ICD-10-CM | POA: Diagnosis not present

## 2019-09-21 DIAGNOSIS — M542 Cervicalgia: Secondary | ICD-10-CM | POA: Diagnosis not present

## 2019-09-21 DIAGNOSIS — T7840XD Allergy, unspecified, subsequent encounter: Secondary | ICD-10-CM | POA: Diagnosis not present

## 2019-09-21 DIAGNOSIS — E559 Vitamin D deficiency, unspecified: Secondary | ICD-10-CM | POA: Diagnosis not present

## 2019-09-21 DIAGNOSIS — R7301 Impaired fasting glucose: Secondary | ICD-10-CM | POA: Diagnosis not present

## 2019-09-21 DIAGNOSIS — H409 Unspecified glaucoma: Secondary | ICD-10-CM | POA: Diagnosis not present

## 2019-09-21 DIAGNOSIS — R600 Localized edema: Secondary | ICD-10-CM | POA: Diagnosis not present

## 2019-09-21 DIAGNOSIS — Z0001 Encounter for general adult medical examination with abnormal findings: Secondary | ICD-10-CM | POA: Diagnosis not present

## 2019-09-21 DIAGNOSIS — E785 Hyperlipidemia, unspecified: Secondary | ICD-10-CM | POA: Diagnosis not present

## 2019-09-21 DIAGNOSIS — E60 Dietary zinc deficiency: Secondary | ICD-10-CM | POA: Diagnosis not present

## 2019-09-21 DIAGNOSIS — H04129 Dry eye syndrome of unspecified lacrimal gland: Secondary | ICD-10-CM | POA: Diagnosis not present

## 2019-09-21 DIAGNOSIS — M797 Fibromyalgia: Secondary | ICD-10-CM | POA: Diagnosis not present

## 2019-09-27 ENCOUNTER — Telehealth: Payer: Self-pay | Admitting: Gastroenterology

## 2019-09-27 NOTE — Telephone Encounter (Signed)
Spoke with [facility]. They will compound suppository with hydrocortisone 25 mg with 2% lidocaine. They will reach out to patient.

## 2019-09-27 NOTE — Telephone Encounter (Signed)
Noted  

## 2019-09-27 NOTE — Telephone Encounter (Signed)
Pt said that the RX she picked up is not right.  She said that there was a cream she used in the past that was Hydrocortisone and Lidocaine compounded. She wants [place] to send a RX for that in suppository form.  Can we help?

## 2019-09-27 NOTE — Telephone Encounter (Signed)
Called pt and she said that she spoke to Stacey Atkinson and that she got everything straight with them.  She said that they do have the suppositories.

## 2019-09-27 NOTE — Telephone Encounter (Signed)
Pt called asking for AS. I told her AS was at lunch and I would let her be aware that she had called. [phone]

## 2019-09-27 NOTE — Telephone Encounter (Signed)
Pt called again to say that we need to call the compound section of [facility] so she can get her prescription as a suppository and not a cream. Please advise. [phone]

## 2019-09-28 DIAGNOSIS — E782 Mixed hyperlipidemia: Secondary | ICD-10-CM | POA: Diagnosis not present

## 2019-09-28 DIAGNOSIS — M542 Cervicalgia: Secondary | ICD-10-CM | POA: Diagnosis not present

## 2019-09-28 DIAGNOSIS — M545 Low back pain: Secondary | ICD-10-CM | POA: Diagnosis not present

## 2019-09-28 DIAGNOSIS — R109 Unspecified abdominal pain: Secondary | ICD-10-CM | POA: Diagnosis not present

## 2019-09-28 DIAGNOSIS — M797 Fibromyalgia: Secondary | ICD-10-CM | POA: Diagnosis not present

## 2019-09-28 DIAGNOSIS — H4010X0 Unspecified open-angle glaucoma, stage unspecified: Secondary | ICD-10-CM | POA: Diagnosis not present

## 2019-09-28 DIAGNOSIS — R7301 Impaired fasting glucose: Secondary | ICD-10-CM | POA: Diagnosis not present

## 2019-09-28 DIAGNOSIS — K649 Unspecified hemorrhoids: Secondary | ICD-10-CM | POA: Diagnosis not present

## 2019-09-28 DIAGNOSIS — I10 Essential (primary) hypertension: Secondary | ICD-10-CM | POA: Diagnosis not present

## 2019-10-03 DIAGNOSIS — I10 Essential (primary) hypertension: Secondary | ICD-10-CM | POA: Diagnosis not present

## 2019-10-03 DIAGNOSIS — R7301 Impaired fasting glucose: Secondary | ICD-10-CM | POA: Diagnosis not present

## 2019-10-03 DIAGNOSIS — E7849 Other hyperlipidemia: Secondary | ICD-10-CM | POA: Diagnosis not present

## 2019-10-27 DIAGNOSIS — M542 Cervicalgia: Secondary | ICD-10-CM | POA: Diagnosis not present

## 2019-10-27 DIAGNOSIS — M9902 Segmental and somatic dysfunction of thoracic region: Secondary | ICD-10-CM | POA: Diagnosis not present

## 2019-10-27 DIAGNOSIS — M545 Low back pain: Secondary | ICD-10-CM | POA: Diagnosis not present

## 2019-10-27 DIAGNOSIS — M9901 Segmental and somatic dysfunction of cervical region: Secondary | ICD-10-CM | POA: Diagnosis not present

## 2019-10-27 DIAGNOSIS — M546 Pain in thoracic spine: Secondary | ICD-10-CM | POA: Diagnosis not present

## 2019-10-27 DIAGNOSIS — M9903 Segmental and somatic dysfunction of lumbar region: Secondary | ICD-10-CM | POA: Diagnosis not present

## 2019-11-03 DIAGNOSIS — M542 Cervicalgia: Secondary | ICD-10-CM | POA: Diagnosis not present

## 2019-11-03 DIAGNOSIS — M9902 Segmental and somatic dysfunction of thoracic region: Secondary | ICD-10-CM | POA: Diagnosis not present

## 2019-11-03 DIAGNOSIS — M9903 Segmental and somatic dysfunction of lumbar region: Secondary | ICD-10-CM | POA: Diagnosis not present

## 2019-11-03 DIAGNOSIS — M545 Low back pain: Secondary | ICD-10-CM | POA: Diagnosis not present

## 2019-11-03 DIAGNOSIS — M9901 Segmental and somatic dysfunction of cervical region: Secondary | ICD-10-CM | POA: Diagnosis not present

## 2019-11-03 DIAGNOSIS — M546 Pain in thoracic spine: Secondary | ICD-10-CM | POA: Diagnosis not present

## 2019-12-01 DIAGNOSIS — R7301 Impaired fasting glucose: Secondary | ICD-10-CM | POA: Diagnosis not present

## 2019-12-01 DIAGNOSIS — I10 Essential (primary) hypertension: Secondary | ICD-10-CM | POA: Diagnosis not present

## 2019-12-01 DIAGNOSIS — E782 Mixed hyperlipidemia: Secondary | ICD-10-CM | POA: Diagnosis not present

## 2020-01-12 DIAGNOSIS — Z1231 Encounter for screening mammogram for malignant neoplasm of breast: Secondary | ICD-10-CM | POA: Diagnosis not present

## 2020-01-16 DIAGNOSIS — I10 Essential (primary) hypertension: Secondary | ICD-10-CM | POA: Diagnosis not present

## 2020-01-16 DIAGNOSIS — E785 Hyperlipidemia, unspecified: Secondary | ICD-10-CM | POA: Diagnosis not present

## 2020-01-18 DIAGNOSIS — H40003 Preglaucoma, unspecified, bilateral: Secondary | ICD-10-CM | POA: Diagnosis not present

## 2020-01-26 DIAGNOSIS — H04129 Dry eye syndrome of unspecified lacrimal gland: Secondary | ICD-10-CM | POA: Diagnosis not present

## 2020-01-26 DIAGNOSIS — M797 Fibromyalgia: Secondary | ICD-10-CM | POA: Diagnosis not present

## 2020-01-26 DIAGNOSIS — E785 Hyperlipidemia, unspecified: Secondary | ICD-10-CM | POA: Diagnosis not present

## 2020-01-26 DIAGNOSIS — R7301 Impaired fasting glucose: Secondary | ICD-10-CM | POA: Diagnosis not present

## 2020-01-26 DIAGNOSIS — I10 Essential (primary) hypertension: Secondary | ICD-10-CM | POA: Diagnosis not present

## 2020-01-26 DIAGNOSIS — M542 Cervicalgia: Secondary | ICD-10-CM | POA: Diagnosis not present

## 2020-01-26 DIAGNOSIS — Z0001 Encounter for general adult medical examination with abnormal findings: Secondary | ICD-10-CM | POA: Diagnosis not present

## 2020-01-26 DIAGNOSIS — H409 Unspecified glaucoma: Secondary | ICD-10-CM | POA: Diagnosis not present

## 2020-01-26 DIAGNOSIS — T7840XD Allergy, unspecified, subsequent encounter: Secondary | ICD-10-CM | POA: Diagnosis not present

## 2020-01-26 DIAGNOSIS — Z0000 Encounter for general adult medical examination without abnormal findings: Secondary | ICD-10-CM | POA: Diagnosis not present

## 2020-01-26 DIAGNOSIS — R600 Localized edema: Secondary | ICD-10-CM | POA: Diagnosis not present

## 2020-01-31 DIAGNOSIS — I10 Essential (primary) hypertension: Secondary | ICD-10-CM | POA: Diagnosis not present

## 2020-01-31 DIAGNOSIS — H4010X0 Unspecified open-angle glaucoma, stage unspecified: Secondary | ICD-10-CM | POA: Diagnosis not present

## 2020-01-31 DIAGNOSIS — M542 Cervicalgia: Secondary | ICD-10-CM | POA: Diagnosis not present

## 2020-01-31 DIAGNOSIS — Z0001 Encounter for general adult medical examination with abnormal findings: Secondary | ICD-10-CM | POA: Diagnosis not present

## 2020-01-31 DIAGNOSIS — M545 Low back pain: Secondary | ICD-10-CM | POA: Diagnosis not present

## 2020-01-31 DIAGNOSIS — E782 Mixed hyperlipidemia: Secondary | ICD-10-CM | POA: Diagnosis not present

## 2020-01-31 DIAGNOSIS — K649 Unspecified hemorrhoids: Secondary | ICD-10-CM | POA: Diagnosis not present

## 2020-01-31 DIAGNOSIS — R7301 Impaired fasting glucose: Secondary | ICD-10-CM | POA: Diagnosis not present

## 2020-01-31 DIAGNOSIS — M797 Fibromyalgia: Secondary | ICD-10-CM | POA: Diagnosis not present

## 2020-03-04 DIAGNOSIS — M9902 Segmental and somatic dysfunction of thoracic region: Secondary | ICD-10-CM | POA: Diagnosis not present

## 2020-03-04 DIAGNOSIS — M542 Cervicalgia: Secondary | ICD-10-CM | POA: Diagnosis not present

## 2020-03-04 DIAGNOSIS — M9903 Segmental and somatic dysfunction of lumbar region: Secondary | ICD-10-CM | POA: Diagnosis not present

## 2020-03-04 DIAGNOSIS — M546 Pain in thoracic spine: Secondary | ICD-10-CM | POA: Diagnosis not present

## 2020-03-04 DIAGNOSIS — M9901 Segmental and somatic dysfunction of cervical region: Secondary | ICD-10-CM | POA: Diagnosis not present

## 2020-03-04 DIAGNOSIS — M545 Low back pain: Secondary | ICD-10-CM | POA: Diagnosis not present

## 2020-03-05 DIAGNOSIS — I83891 Varicose veins of right lower extremities with other complications: Secondary | ICD-10-CM | POA: Diagnosis not present

## 2020-03-05 DIAGNOSIS — I83813 Varicose veins of bilateral lower extremities with pain: Secondary | ICD-10-CM | POA: Diagnosis not present

## 2020-03-08 DIAGNOSIS — M545 Low back pain: Secondary | ICD-10-CM | POA: Diagnosis not present

## 2020-03-08 DIAGNOSIS — M9902 Segmental and somatic dysfunction of thoracic region: Secondary | ICD-10-CM | POA: Diagnosis not present

## 2020-03-08 DIAGNOSIS — M9903 Segmental and somatic dysfunction of lumbar region: Secondary | ICD-10-CM | POA: Diagnosis not present

## 2020-03-08 DIAGNOSIS — M542 Cervicalgia: Secondary | ICD-10-CM | POA: Diagnosis not present

## 2020-03-08 DIAGNOSIS — M9901 Segmental and somatic dysfunction of cervical region: Secondary | ICD-10-CM | POA: Diagnosis not present

## 2020-03-08 DIAGNOSIS — M546 Pain in thoracic spine: Secondary | ICD-10-CM | POA: Diagnosis not present

## 2020-03-11 ENCOUNTER — Telehealth: Payer: Self-pay | Admitting: Allergy & Immunology

## 2020-03-11 NOTE — Telephone Encounter (Signed)
Patient called and she is in [place] and need to get refill on Cetirizine HCI 10 mg caps. I told her we may not be able to refill it bcause she has not been her since 2019. [phone].

## 2020-03-11 NOTE — Telephone Encounter (Signed)
Patient advised that seeing how she has not been seen since 2019 we would not be able to refill the medication. I did inform her that generic cetirizine is available over the counter and she will go purchase some as she does not like to come to the doctor unless she has too.

## 2020-03-15 DIAGNOSIS — M546 Pain in thoracic spine: Secondary | ICD-10-CM | POA: Diagnosis not present

## 2020-03-15 DIAGNOSIS — M9901 Segmental and somatic dysfunction of cervical region: Secondary | ICD-10-CM | POA: Diagnosis not present

## 2020-03-15 DIAGNOSIS — M9902 Segmental and somatic dysfunction of thoracic region: Secondary | ICD-10-CM | POA: Diagnosis not present

## 2020-03-15 DIAGNOSIS — M545 Low back pain: Secondary | ICD-10-CM | POA: Diagnosis not present

## 2020-03-15 DIAGNOSIS — M542 Cervicalgia: Secondary | ICD-10-CM | POA: Diagnosis not present

## 2020-03-15 DIAGNOSIS — M9903 Segmental and somatic dysfunction of lumbar region: Secondary | ICD-10-CM | POA: Diagnosis not present

## 2020-03-19 DIAGNOSIS — E785 Hyperlipidemia, unspecified: Secondary | ICD-10-CM | POA: Diagnosis not present

## 2020-03-19 DIAGNOSIS — R7301 Impaired fasting glucose: Secondary | ICD-10-CM | POA: Diagnosis not present

## 2020-03-19 DIAGNOSIS — I10 Essential (primary) hypertension: Secondary | ICD-10-CM | POA: Diagnosis not present

## 2020-04-02 DIAGNOSIS — I83813 Varicose veins of bilateral lower extremities with pain: Secondary | ICD-10-CM | POA: Diagnosis not present

## 2020-04-02 DIAGNOSIS — I83893 Varicose veins of bilateral lower extremities with other complications: Secondary | ICD-10-CM | POA: Diagnosis not present

## 2020-04-18 DIAGNOSIS — E785 Hyperlipidemia, unspecified: Secondary | ICD-10-CM | POA: Diagnosis not present

## 2020-04-18 DIAGNOSIS — I10 Essential (primary) hypertension: Secondary | ICD-10-CM | POA: Diagnosis not present

## 2020-04-18 DIAGNOSIS — R7301 Impaired fasting glucose: Secondary | ICD-10-CM | POA: Diagnosis not present

## 2020-04-22 DIAGNOSIS — I8312 Varicose veins of left lower extremity with inflammation: Secondary | ICD-10-CM | POA: Diagnosis not present

## 2020-04-22 DIAGNOSIS — I83893 Varicose veins of bilateral lower extremities with other complications: Secondary | ICD-10-CM | POA: Diagnosis not present

## 2020-04-22 DIAGNOSIS — I8311 Varicose veins of right lower extremity with inflammation: Secondary | ICD-10-CM | POA: Diagnosis not present

## 2020-04-22 DIAGNOSIS — I83813 Varicose veins of bilateral lower extremities with pain: Secondary | ICD-10-CM | POA: Diagnosis not present

## 2020-05-10 DIAGNOSIS — R7301 Impaired fasting glucose: Secondary | ICD-10-CM | POA: Diagnosis not present

## 2020-05-10 DIAGNOSIS — I10 Essential (primary) hypertension: Secondary | ICD-10-CM | POA: Diagnosis not present

## 2020-05-10 DIAGNOSIS — E785 Hyperlipidemia, unspecified: Secondary | ICD-10-CM | POA: Diagnosis not present

## 2020-06-05 DIAGNOSIS — I83811 Varicose veins of right lower extremities with pain: Secondary | ICD-10-CM | POA: Diagnosis not present

## 2020-06-05 DIAGNOSIS — I8311 Varicose veins of right lower extremity with inflammation: Secondary | ICD-10-CM | POA: Diagnosis not present

## 2020-06-07 DIAGNOSIS — I8311 Varicose veins of right lower extremity with inflammation: Secondary | ICD-10-CM | POA: Diagnosis not present

## 2020-06-18 DIAGNOSIS — I10 Essential (primary) hypertension: Secondary | ICD-10-CM | POA: Diagnosis not present

## 2020-06-18 DIAGNOSIS — E785 Hyperlipidemia, unspecified: Secondary | ICD-10-CM | POA: Diagnosis not present

## 2020-06-18 DIAGNOSIS — R7301 Impaired fasting glucose: Secondary | ICD-10-CM | POA: Diagnosis not present

## 2020-07-24 DIAGNOSIS — H40053 Ocular hypertension, bilateral: Secondary | ICD-10-CM | POA: Diagnosis not present

## 2020-08-12 DIAGNOSIS — I83811 Varicose veins of right lower extremities with pain: Secondary | ICD-10-CM | POA: Diagnosis not present

## 2020-08-12 DIAGNOSIS — I8311 Varicose veins of right lower extremity with inflammation: Secondary | ICD-10-CM | POA: Diagnosis not present

## 2020-08-14 DIAGNOSIS — I8311 Varicose veins of right lower extremity with inflammation: Secondary | ICD-10-CM | POA: Diagnosis not present

## 2020-08-17 DIAGNOSIS — I10 Essential (primary) hypertension: Secondary | ICD-10-CM | POA: Diagnosis not present

## 2020-08-17 DIAGNOSIS — R7301 Impaired fasting glucose: Secondary | ICD-10-CM | POA: Diagnosis not present

## 2020-08-17 DIAGNOSIS — E782 Mixed hyperlipidemia: Secondary | ICD-10-CM | POA: Diagnosis not present

## 2020-08-21 DIAGNOSIS — I83812 Varicose veins of left lower extremities with pain: Secondary | ICD-10-CM | POA: Diagnosis not present

## 2020-08-21 DIAGNOSIS — I8312 Varicose veins of left lower extremity with inflammation: Secondary | ICD-10-CM | POA: Diagnosis not present

## 2020-08-27 DIAGNOSIS — I8312 Varicose veins of left lower extremity with inflammation: Secondary | ICD-10-CM | POA: Diagnosis not present

## 2020-08-29 DIAGNOSIS — M797 Fibromyalgia: Secondary | ICD-10-CM | POA: Diagnosis not present

## 2020-08-29 DIAGNOSIS — R600 Localized edema: Secondary | ICD-10-CM | POA: Diagnosis not present

## 2020-08-29 DIAGNOSIS — T7840XD Allergy, unspecified, subsequent encounter: Secondary | ICD-10-CM | POA: Diagnosis not present

## 2020-08-29 DIAGNOSIS — H04129 Dry eye syndrome of unspecified lacrimal gland: Secondary | ICD-10-CM | POA: Diagnosis not present

## 2020-08-29 DIAGNOSIS — Z0001 Encounter for general adult medical examination with abnormal findings: Secondary | ICD-10-CM | POA: Diagnosis not present

## 2020-08-29 DIAGNOSIS — E785 Hyperlipidemia, unspecified: Secondary | ICD-10-CM | POA: Diagnosis not present

## 2020-08-29 DIAGNOSIS — M542 Cervicalgia: Secondary | ICD-10-CM | POA: Diagnosis not present

## 2020-08-29 DIAGNOSIS — H409 Unspecified glaucoma: Secondary | ICD-10-CM | POA: Diagnosis not present

## 2020-08-29 DIAGNOSIS — Z0000 Encounter for general adult medical examination without abnormal findings: Secondary | ICD-10-CM | POA: Diagnosis not present

## 2020-09-02 DIAGNOSIS — M542 Cervicalgia: Secondary | ICD-10-CM | POA: Diagnosis not present

## 2020-09-02 DIAGNOSIS — R7301 Impaired fasting glucose: Secondary | ICD-10-CM | POA: Diagnosis not present

## 2020-09-02 DIAGNOSIS — K649 Unspecified hemorrhoids: Secondary | ICD-10-CM | POA: Diagnosis not present

## 2020-09-02 DIAGNOSIS — R109 Unspecified abdominal pain: Secondary | ICD-10-CM | POA: Diagnosis not present

## 2020-09-02 DIAGNOSIS — M797 Fibromyalgia: Secondary | ICD-10-CM | POA: Diagnosis not present

## 2020-09-02 DIAGNOSIS — E782 Mixed hyperlipidemia: Secondary | ICD-10-CM | POA: Diagnosis not present

## 2020-09-02 DIAGNOSIS — M5481 Occipital neuralgia: Secondary | ICD-10-CM | POA: Diagnosis not present

## 2020-09-02 DIAGNOSIS — H4010X0 Unspecified open-angle glaucoma, stage unspecified: Secondary | ICD-10-CM | POA: Diagnosis not present

## 2020-09-02 DIAGNOSIS — I10 Essential (primary) hypertension: Secondary | ICD-10-CM | POA: Diagnosis not present

## 2020-09-09 DIAGNOSIS — I8311 Varicose veins of right lower extremity with inflammation: Secondary | ICD-10-CM | POA: Diagnosis not present

## 2020-10-07 DIAGNOSIS — I8312 Varicose veins of left lower extremity with inflammation: Secondary | ICD-10-CM | POA: Diagnosis not present

## 2020-10-07 DIAGNOSIS — I83812 Varicose veins of left lower extremities with pain: Secondary | ICD-10-CM | POA: Diagnosis not present

## 2020-10-14 DIAGNOSIS — M9902 Segmental and somatic dysfunction of thoracic region: Secondary | ICD-10-CM | POA: Diagnosis not present

## 2020-10-14 DIAGNOSIS — M9901 Segmental and somatic dysfunction of cervical region: Secondary | ICD-10-CM | POA: Diagnosis not present

## 2020-10-14 DIAGNOSIS — M542 Cervicalgia: Secondary | ICD-10-CM | POA: Diagnosis not present

## 2020-10-14 DIAGNOSIS — M546 Pain in thoracic spine: Secondary | ICD-10-CM | POA: Diagnosis not present

## 2020-10-16 DIAGNOSIS — R109 Unspecified abdominal pain: Secondary | ICD-10-CM | POA: Diagnosis not present

## 2020-10-16 DIAGNOSIS — M545 Low back pain, unspecified: Secondary | ICD-10-CM | POA: Diagnosis not present

## 2020-10-16 DIAGNOSIS — K649 Unspecified hemorrhoids: Secondary | ICD-10-CM | POA: Diagnosis not present

## 2020-10-16 DIAGNOSIS — I10 Essential (primary) hypertension: Secondary | ICD-10-CM | POA: Diagnosis not present

## 2020-10-16 DIAGNOSIS — M542 Cervicalgia: Secondary | ICD-10-CM | POA: Diagnosis not present

## 2020-10-16 DIAGNOSIS — M797 Fibromyalgia: Secondary | ICD-10-CM | POA: Diagnosis not present

## 2020-10-16 DIAGNOSIS — E782 Mixed hyperlipidemia: Secondary | ICD-10-CM | POA: Diagnosis not present

## 2020-10-16 DIAGNOSIS — M5481 Occipital neuralgia: Secondary | ICD-10-CM | POA: Diagnosis not present

## 2020-10-17 DIAGNOSIS — M7981 Nontraumatic hematoma of soft tissue: Secondary | ICD-10-CM | POA: Diagnosis not present

## 2020-10-21 DIAGNOSIS — I83811 Varicose veins of right lower extremities with pain: Secondary | ICD-10-CM | POA: Diagnosis not present

## 2020-10-21 DIAGNOSIS — I8311 Varicose veins of right lower extremity with inflammation: Secondary | ICD-10-CM | POA: Diagnosis not present

## 2020-11-06 DIAGNOSIS — M7981 Nontraumatic hematoma of soft tissue: Secondary | ICD-10-CM | POA: Diagnosis not present

## 2020-11-08 DIAGNOSIS — M9902 Segmental and somatic dysfunction of thoracic region: Secondary | ICD-10-CM | POA: Diagnosis not present

## 2020-11-08 DIAGNOSIS — M9901 Segmental and somatic dysfunction of cervical region: Secondary | ICD-10-CM | POA: Diagnosis not present

## 2020-11-08 DIAGNOSIS — M546 Pain in thoracic spine: Secondary | ICD-10-CM | POA: Diagnosis not present

## 2020-11-08 DIAGNOSIS — M542 Cervicalgia: Secondary | ICD-10-CM | POA: Diagnosis not present

## 2020-11-13 DIAGNOSIS — M9901 Segmental and somatic dysfunction of cervical region: Secondary | ICD-10-CM | POA: Diagnosis not present

## 2020-11-13 DIAGNOSIS — M546 Pain in thoracic spine: Secondary | ICD-10-CM | POA: Diagnosis not present

## 2020-11-13 DIAGNOSIS — M542 Cervicalgia: Secondary | ICD-10-CM | POA: Diagnosis not present

## 2020-11-13 DIAGNOSIS — M9902 Segmental and somatic dysfunction of thoracic region: Secondary | ICD-10-CM | POA: Diagnosis not present

## 2020-11-15 DIAGNOSIS — M546 Pain in thoracic spine: Secondary | ICD-10-CM | POA: Diagnosis not present

## 2020-11-15 DIAGNOSIS — M542 Cervicalgia: Secondary | ICD-10-CM | POA: Diagnosis not present

## 2020-11-15 DIAGNOSIS — M9901 Segmental and somatic dysfunction of cervical region: Secondary | ICD-10-CM | POA: Diagnosis not present

## 2020-11-15 DIAGNOSIS — M9902 Segmental and somatic dysfunction of thoracic region: Secondary | ICD-10-CM | POA: Diagnosis not present

## 2020-12-09 DIAGNOSIS — I10 Essential (primary) hypertension: Secondary | ICD-10-CM | POA: Diagnosis not present

## 2020-12-10 DIAGNOSIS — I8312 Varicose veins of left lower extremity with inflammation: Secondary | ICD-10-CM | POA: Diagnosis not present

## 2020-12-10 DIAGNOSIS — M7981 Nontraumatic hematoma of soft tissue: Secondary | ICD-10-CM | POA: Diagnosis not present

## 2020-12-10 DIAGNOSIS — I83812 Varicose veins of left lower extremities with pain: Secondary | ICD-10-CM | POA: Diagnosis not present

## 2020-12-12 DIAGNOSIS — E782 Mixed hyperlipidemia: Secondary | ICD-10-CM | POA: Diagnosis not present

## 2020-12-12 DIAGNOSIS — I83893 Varicose veins of bilateral lower extremities with other complications: Secondary | ICD-10-CM | POA: Diagnosis not present

## 2020-12-12 DIAGNOSIS — M797 Fibromyalgia: Secondary | ICD-10-CM | POA: Diagnosis not present

## 2020-12-12 DIAGNOSIS — I10 Essential (primary) hypertension: Secondary | ICD-10-CM | POA: Diagnosis not present

## 2020-12-12 DIAGNOSIS — H409 Unspecified glaucoma: Secondary | ICD-10-CM | POA: Diagnosis not present

## 2020-12-12 DIAGNOSIS — R7301 Impaired fasting glucose: Secondary | ICD-10-CM | POA: Diagnosis not present

## 2020-12-12 DIAGNOSIS — E559 Vitamin D deficiency, unspecified: Secondary | ICD-10-CM | POA: Diagnosis not present

## 2020-12-12 DIAGNOSIS — M542 Cervicalgia: Secondary | ICD-10-CM | POA: Diagnosis not present

## 2020-12-12 DIAGNOSIS — L282 Other prurigo: Secondary | ICD-10-CM | POA: Diagnosis not present

## 2020-12-27 DIAGNOSIS — I8312 Varicose veins of left lower extremity with inflammation: Secondary | ICD-10-CM | POA: Diagnosis not present

## 2020-12-27 DIAGNOSIS — I83812 Varicose veins of left lower extremities with pain: Secondary | ICD-10-CM | POA: Diagnosis not present

## 2020-12-27 DIAGNOSIS — M7981 Nontraumatic hematoma of soft tissue: Secondary | ICD-10-CM | POA: Diagnosis not present

## 2021-01-01 DIAGNOSIS — G894 Chronic pain syndrome: Secondary | ICD-10-CM | POA: Diagnosis not present

## 2021-01-01 DIAGNOSIS — R7301 Impaired fasting glucose: Secondary | ICD-10-CM | POA: Diagnosis not present

## 2021-01-01 DIAGNOSIS — E782 Mixed hyperlipidemia: Secondary | ICD-10-CM | POA: Diagnosis not present

## 2021-01-16 DIAGNOSIS — I10 Essential (primary) hypertension: Secondary | ICD-10-CM | POA: Diagnosis not present

## 2021-01-16 DIAGNOSIS — E1165 Type 2 diabetes mellitus with hyperglycemia: Secondary | ICD-10-CM | POA: Diagnosis not present

## 2021-01-16 DIAGNOSIS — E7800 Pure hypercholesterolemia, unspecified: Secondary | ICD-10-CM | POA: Diagnosis not present

## 2021-01-17 DIAGNOSIS — Z1231 Encounter for screening mammogram for malignant neoplasm of breast: Secondary | ICD-10-CM | POA: Diagnosis not present

## 2021-01-21 DIAGNOSIS — I8311 Varicose veins of right lower extremity with inflammation: Secondary | ICD-10-CM | POA: Diagnosis not present

## 2021-01-29 DIAGNOSIS — M5413 Radiculopathy, cervicothoracic region: Secondary | ICD-10-CM | POA: Diagnosis not present

## 2021-01-29 DIAGNOSIS — M9901 Segmental and somatic dysfunction of cervical region: Secondary | ICD-10-CM | POA: Diagnosis not present

## 2021-01-29 DIAGNOSIS — M546 Pain in thoracic spine: Secondary | ICD-10-CM | POA: Diagnosis not present

## 2021-01-29 DIAGNOSIS — M9902 Segmental and somatic dysfunction of thoracic region: Secondary | ICD-10-CM | POA: Diagnosis not present

## 2021-02-03 DIAGNOSIS — M9902 Segmental and somatic dysfunction of thoracic region: Secondary | ICD-10-CM | POA: Diagnosis not present

## 2021-02-03 DIAGNOSIS — M546 Pain in thoracic spine: Secondary | ICD-10-CM | POA: Diagnosis not present

## 2021-02-03 DIAGNOSIS — M5413 Radiculopathy, cervicothoracic region: Secondary | ICD-10-CM | POA: Diagnosis not present

## 2021-02-03 DIAGNOSIS — M9901 Segmental and somatic dysfunction of cervical region: Secondary | ICD-10-CM | POA: Diagnosis not present

## 2021-02-14 DIAGNOSIS — M546 Pain in thoracic spine: Secondary | ICD-10-CM | POA: Diagnosis not present

## 2021-02-14 DIAGNOSIS — M5413 Radiculopathy, cervicothoracic region: Secondary | ICD-10-CM | POA: Diagnosis not present

## 2021-02-14 DIAGNOSIS — M9901 Segmental and somatic dysfunction of cervical region: Secondary | ICD-10-CM | POA: Diagnosis not present

## 2021-02-14 DIAGNOSIS — M9902 Segmental and somatic dysfunction of thoracic region: Secondary | ICD-10-CM | POA: Diagnosis not present

## 2021-02-24 DIAGNOSIS — R309 Painful micturition, unspecified: Secondary | ICD-10-CM | POA: Diagnosis not present

## 2021-02-24 DIAGNOSIS — R1032 Left lower quadrant pain: Secondary | ICD-10-CM | POA: Diagnosis not present

## 2021-03-19 DIAGNOSIS — I10 Essential (primary) hypertension: Secondary | ICD-10-CM | POA: Diagnosis not present

## 2021-03-19 DIAGNOSIS — E119 Type 2 diabetes mellitus without complications: Secondary | ICD-10-CM | POA: Diagnosis not present

## 2021-03-21 DIAGNOSIS — M9901 Segmental and somatic dysfunction of cervical region: Secondary | ICD-10-CM | POA: Diagnosis not present

## 2021-03-21 DIAGNOSIS — M546 Pain in thoracic spine: Secondary | ICD-10-CM | POA: Diagnosis not present

## 2021-03-21 DIAGNOSIS — M5413 Radiculopathy, cervicothoracic region: Secondary | ICD-10-CM | POA: Diagnosis not present

## 2021-03-21 DIAGNOSIS — M9902 Segmental and somatic dysfunction of thoracic region: Secondary | ICD-10-CM | POA: Diagnosis not present

## 2021-03-28 DIAGNOSIS — M546 Pain in thoracic spine: Secondary | ICD-10-CM | POA: Diagnosis not present

## 2021-03-28 DIAGNOSIS — M9902 Segmental and somatic dysfunction of thoracic region: Secondary | ICD-10-CM | POA: Diagnosis not present

## 2021-03-28 DIAGNOSIS — M5413 Radiculopathy, cervicothoracic region: Secondary | ICD-10-CM | POA: Diagnosis not present

## 2021-03-28 DIAGNOSIS — M9901 Segmental and somatic dysfunction of cervical region: Secondary | ICD-10-CM | POA: Diagnosis not present

## 2021-04-02 DIAGNOSIS — R7301 Impaired fasting glucose: Secondary | ICD-10-CM | POA: Diagnosis not present

## 2021-04-02 DIAGNOSIS — E782 Mixed hyperlipidemia: Secondary | ICD-10-CM | POA: Diagnosis not present

## 2021-04-09 DIAGNOSIS — E782 Mixed hyperlipidemia: Secondary | ICD-10-CM | POA: Diagnosis not present

## 2021-04-09 DIAGNOSIS — E669 Obesity, unspecified: Secondary | ICD-10-CM | POA: Diagnosis not present

## 2021-04-09 DIAGNOSIS — R7301 Impaired fasting glucose: Secondary | ICD-10-CM | POA: Diagnosis not present

## 2021-04-09 DIAGNOSIS — Z0001 Encounter for general adult medical examination with abnormal findings: Secondary | ICD-10-CM | POA: Diagnosis not present

## 2021-04-09 DIAGNOSIS — Z8249 Family history of ischemic heart disease and other diseases of the circulatory system: Secondary | ICD-10-CM | POA: Diagnosis not present

## 2021-04-18 DIAGNOSIS — I10 Essential (primary) hypertension: Secondary | ICD-10-CM | POA: Diagnosis not present

## 2021-04-18 DIAGNOSIS — E785 Hyperlipidemia, unspecified: Secondary | ICD-10-CM | POA: Diagnosis not present

## 2021-05-19 DIAGNOSIS — M9902 Segmental and somatic dysfunction of thoracic region: Secondary | ICD-10-CM | POA: Diagnosis not present

## 2021-05-19 DIAGNOSIS — I10 Essential (primary) hypertension: Secondary | ICD-10-CM | POA: Diagnosis not present

## 2021-05-19 DIAGNOSIS — M9901 Segmental and somatic dysfunction of cervical region: Secondary | ICD-10-CM | POA: Diagnosis not present

## 2021-05-19 DIAGNOSIS — E782 Mixed hyperlipidemia: Secondary | ICD-10-CM | POA: Diagnosis not present

## 2021-05-19 DIAGNOSIS — M546 Pain in thoracic spine: Secondary | ICD-10-CM | POA: Diagnosis not present

## 2021-05-19 DIAGNOSIS — M9903 Segmental and somatic dysfunction of lumbar region: Secondary | ICD-10-CM | POA: Diagnosis not present

## 2021-05-19 DIAGNOSIS — M5413 Radiculopathy, cervicothoracic region: Secondary | ICD-10-CM | POA: Diagnosis not present

## 2021-05-21 DIAGNOSIS — M9901 Segmental and somatic dysfunction of cervical region: Secondary | ICD-10-CM | POA: Diagnosis not present

## 2021-05-21 DIAGNOSIS — M9902 Segmental and somatic dysfunction of thoracic region: Secondary | ICD-10-CM | POA: Diagnosis not present

## 2021-05-21 DIAGNOSIS — M5413 Radiculopathy, cervicothoracic region: Secondary | ICD-10-CM | POA: Diagnosis not present

## 2021-05-21 DIAGNOSIS — M546 Pain in thoracic spine: Secondary | ICD-10-CM | POA: Diagnosis not present

## 2021-05-21 DIAGNOSIS — M9903 Segmental and somatic dysfunction of lumbar region: Secondary | ICD-10-CM | POA: Diagnosis not present

## 2021-06-18 DIAGNOSIS — E782 Mixed hyperlipidemia: Secondary | ICD-10-CM | POA: Diagnosis not present

## 2021-06-18 DIAGNOSIS — I10 Essential (primary) hypertension: Secondary | ICD-10-CM | POA: Diagnosis not present

## 2021-06-19 DIAGNOSIS — I87393 Chronic venous hypertension (idiopathic) with other complications of bilateral lower extremity: Secondary | ICD-10-CM | POA: Diagnosis not present

## 2021-06-19 DIAGNOSIS — I872 Venous insufficiency (chronic) (peripheral): Secondary | ICD-10-CM | POA: Diagnosis not present

## 2021-07-08 DIAGNOSIS — E782 Mixed hyperlipidemia: Secondary | ICD-10-CM | POA: Diagnosis not present

## 2021-07-08 DIAGNOSIS — R002 Palpitations: Secondary | ICD-10-CM | POA: Diagnosis not present

## 2021-07-08 DIAGNOSIS — R7301 Impaired fasting glucose: Secondary | ICD-10-CM | POA: Diagnosis not present

## 2021-07-08 DIAGNOSIS — Z8249 Family history of ischemic heart disease and other diseases of the circulatory system: Secondary | ICD-10-CM | POA: Diagnosis not present

## 2021-07-08 DIAGNOSIS — M79672 Pain in left foot: Secondary | ICD-10-CM | POA: Diagnosis not present

## 2021-07-08 DIAGNOSIS — E669 Obesity, unspecified: Secondary | ICD-10-CM | POA: Diagnosis not present

## 2021-07-08 DIAGNOSIS — Z6837 Body mass index (BMI) 37.0-37.9, adult: Secondary | ICD-10-CM | POA: Diagnosis not present

## 2021-07-08 DIAGNOSIS — L918 Other hypertrophic disorders of the skin: Secondary | ICD-10-CM | POA: Diagnosis not present

## 2021-07-08 DIAGNOSIS — G894 Chronic pain syndrome: Secondary | ICD-10-CM | POA: Diagnosis not present

## 2021-07-31 DIAGNOSIS — H40053 Ocular hypertension, bilateral: Secondary | ICD-10-CM | POA: Diagnosis not present

## 2021-07-31 DIAGNOSIS — H5213 Myopia, bilateral: Secondary | ICD-10-CM | POA: Diagnosis not present

## 2021-08-04 DIAGNOSIS — M9902 Segmental and somatic dysfunction of thoracic region: Secondary | ICD-10-CM | POA: Diagnosis not present

## 2021-08-04 DIAGNOSIS — M9901 Segmental and somatic dysfunction of cervical region: Secondary | ICD-10-CM | POA: Diagnosis not present

## 2021-08-04 DIAGNOSIS — M5413 Radiculopathy, cervicothoracic region: Secondary | ICD-10-CM | POA: Diagnosis not present

## 2021-08-04 DIAGNOSIS — M9903 Segmental and somatic dysfunction of lumbar region: Secondary | ICD-10-CM | POA: Diagnosis not present

## 2021-08-04 DIAGNOSIS — M546 Pain in thoracic spine: Secondary | ICD-10-CM | POA: Diagnosis not present

## 2021-08-19 DIAGNOSIS — E782 Mixed hyperlipidemia: Secondary | ICD-10-CM | POA: Diagnosis not present

## 2021-08-19 DIAGNOSIS — I10 Essential (primary) hypertension: Secondary | ICD-10-CM | POA: Diagnosis not present

## 2021-08-21 NOTE — Progress Notes (Signed)
CARDIOLOGY CONSULT NOTE       Patient ID: Stacey Atkinson MRN: [id number] DOB/AGE: 12/07/1945 76 y.o.  Admit date: (Not on file) Referring Physician: Nevada Crane Primary Physician: Celene Squibb, MD Primary Cardiologist: New Reason for Consultation: Palpitations/Tachycardia  Active Problems:   * No active hospital problems. *   HPI:  76 y.o. referred by Dr Nevada Crane for tachycardia and palpitations. History of asthma, eczema, angioedema HTN, and HLD Came to primary office 07/08/21 for labs and complained of left foot pain, fluttering in hear with hot feeling in face for last 203 weeks at night Also with growth behind left ear for 2 months She is on zetia for HLD and HCTZ for HTN  Likely has familial HLD with LDL 203 but says she doesn't tolerate statins or nexlizet after taking for only a few days. Pain in foot is more neuropathic She has had HTN and told she has an enlarged heart from it. Has chronic pain ? Fibromyalgia in back hips and knees Given oxycodone script at visit   Her palpitations are much better Seem related to a friend that moved in with her after moving from [place] and turned out to be bipolar has not had them in 2 weeks  Discussed lipid management and referral  to Dr Debara Pickett ROS All other systems reviewed and negative except as noted above  Past Medical History:  Diagnosis Date   Angio-edema    Asthma    1990. no problems in years   Eczema    Glaucoma    HTN (hypertension)    Hyperlipidemia    Urticaria     Family History  Problem Relation Age of Onset   Allergic rhinitis Mother    Pancreatic cancer Mother    Asthma Sister    Inflammatory bowel disease Neg Hx    Colon cancer Neg Hx    Celiac disease Neg Hx     Social History   Socioeconomic History   Marital status: Divorced    Spouse name: Not on file   Number of children: Not on file   Years of education: Not on file   Highest education level: Not on file  Occupational History   Not on file  Tobacco Use    Smoking status: Never   Smokeless tobacco: Never  Vaping Use   Vaping Use: Never used  Substance and Sexual Activity   Alcohol use: Yes    Comment: occ   Drug use: Never   Sexual activity: Not on file  Other Topics Concern   Not on file  Social History Narrative   Not on file   Social Determinants of Health   Financial Resource Strain: Not on file  Food Insecurity: Not on file  Transportation Needs: Not on file  Physical Activity: Not on file  Stress: Not on file  Social Connections: Not on file  Intimate Partner Violence: Not on file    Past Surgical History:  Procedure Laterality Date   CESAREAN SECTION  1974/1979   COLONOSCOPY  10/2012   Dr. Jackquline [place]: mild sigmoid diverticulosis, int/ext hemorrhoids. otherwise normal colon and terminal ileum. next tcs in 10 years,   EYE SURGERY Right 1963   right orbit rebuilt   TONSILLECTOMY        Current Outpatient Medications:    b complex vitamins tablet, Take 1 tablet by mouth daily. (when she remembers), Disp: , Rfl:    Black Currant Seed Oil 500 MG CAPS, Take 1 capsule by mouth daily as needed (  ALLERGIES)., Disp: , Rfl:    calcium-vitamin D (OSCAL WITH D) 500-200 MG-UNIT tablet, Take 1 tablet by mouth 2 (two) times daily. (when she remembers), Disp: , Rfl:    Cetirizine HCl 10 MG CAPS, Take 10 mg by mouth daily as needed., Disp: , Rfl:    dorzolamide-timolol (COSOPT) 22.3-6.8 MG/ML ophthalmic solution, Place 1 drop into both eyes 2 (two) times daily. , Disp: , Rfl:    ezetimibe (ZETIA) 10 MG tablet, Take 10 mg by mouth daily., Disp: , Rfl:    hydrochlorothiazide (HYDRODIURIL) 50 MG tablet, Take 25 mg by mouth daily., Disp: , Rfl:    latanoprost (XALATAN) 0.005 % ophthalmic solution, Place 1 drop into both eyes at bedtime. , Disp: , Rfl:    losartan (COZAAR) 100 MG tablet, Take 100 mg by mouth every morning. , Disp: , Rfl:    Melatonin 10 MG TABS, Take 10 mg by mouth at bedtime as needed (for sleep). , Disp: , Rfl:     meloxicam (MOBIC) 7.5 MG tablet, Take 7.5 mg by mouth daily as needed for pain ((Gout)). , Disp: , Rfl:    metoprolol succinate (TOPROL-XL) 100 MG 24 hr tablet, Take 100 mg by mouth every morning. Take with or immediately following a meal. , Disp: , Rfl:    NON FORMULARY, Take 3 capsules by mouth daily. TOTAL RESTORE: to promote healthy gut lining  (when she remembers), Disp: , Rfl:    Oil of Oregano 1500 MG CAPS, Take 1 capsule by mouth daily as needed (ALLERGIES)., Disp: , Rfl:    Omega 3-6-9 Fatty Acids (OMEGA-3-6-9 PO), Take 1 capsule by mouth daily., Disp: , Rfl:    Oxycodone HCl 10 MG TABS, Take 10 mg by mouth daily as needed (for pain). , Disp: , Rfl:    predniSONE (DELTASONE) 50 MG tablet, Take 50 mg orally 13 hours, 7 hours, 1 hour prior to scheduled CT scan. You will also take Benadryl 50 mg 1 hour prior to scheduled CT scan., Disp: 3 tablet, Rfl: 0   Probiotic CAPS, Take 1 capsule by mouth daily. (when she remembers), Disp: , Rfl:    PROCTOFOAM HC rectal foam, Place 1 application rectally 2 (two) times daily as needed for hemorrhoids. , Disp: , Rfl: 2   vitamin C (ASCORBIC ACID) 250 MG tablet, Take 250 mg by mouth daily. (not all the time), Disp: , Rfl:    zinc gluconate 50 MG tablet, Take 50 mg by mouth daily. (not all the time), Disp: , Rfl:     Physical Exam: There were no vitals taken for this visit.    Affect appropriate Healthy:  appears stated age 76: normal Neck supple with no adenopathy JVP normal no bruits no thyromegaly Lungs clear with no wheezing and good diaphragmatic motion Heart:  S1/S2 no murmur, no rub, gallop or click PMI normal Abdomen: benighn, BS positve, no tenderness, no AAA no bruit.  No HSM or HJR Distal pulses intact with no bruits No edema Neuro non-focal Skin warm and dry No muscular weakness   Labs:   Lab Results  Component Value Date   WBC 4.2 05/10/2018   HGB 13.6 05/10/2018   HCT 42.6 05/10/2018   MCV 92.6 05/10/2018   PLT 252  05/10/2018   No results for input(s): NA, K, CL, CO2, BUN, CREATININE, CALCIUM, PROT, BILITOT, ALKPHOS, ALT, AST, GLUCOSE in the last 168 hours.  Invalid input(s): LABALBU Lab Results  Component Value Date   TROPONINI <0.03 05/10/2018  No results found for: CHOL No results found for: HDL No results found for: LDLCALC No results found for: TRIG No results found for: CHOLHDL No results found for: LDLDIRECT    Radiology: No results found.  EKG: SR rate 57 normal 2019    ASSESSMENT AND PLAN:   Palpitations: benign sounding non exertional labs ok 30 day monitor ordered  HLD:  refer to Dr Debara Pickett for familial HLD Discussed using PSK 9 or Inclisiran LDL on most recent labs 171  She is willing to see him and get calcium score  HTN:  Avoid ACE with atopic phenotype on diuretic will check echo for LVH/CE given her history   30 day monitor  Calcium score Echo  Refer to Dr Debara Pickett / Lipid clinic for familial HLD  F/U PRN   Signed: Jenkins Rouge 08/21/2021, 7:07 PM

## 2021-08-22 DIAGNOSIS — E782 Mixed hyperlipidemia: Secondary | ICD-10-CM | POA: Diagnosis not present

## 2021-08-22 DIAGNOSIS — R7301 Impaired fasting glucose: Secondary | ICD-10-CM | POA: Diagnosis not present

## 2021-08-22 DIAGNOSIS — M79672 Pain in left foot: Secondary | ICD-10-CM | POA: Diagnosis not present

## 2021-08-22 DIAGNOSIS — E559 Vitamin D deficiency, unspecified: Secondary | ICD-10-CM | POA: Diagnosis not present

## 2021-08-26 DIAGNOSIS — I83892 Varicose veins of left lower extremities with other complications: Secondary | ICD-10-CM | POA: Diagnosis not present

## 2021-08-26 DIAGNOSIS — I872 Venous insufficiency (chronic) (peripheral): Secondary | ICD-10-CM | POA: Diagnosis not present

## 2021-08-26 DIAGNOSIS — M7989 Other specified soft tissue disorders: Secondary | ICD-10-CM | POA: Diagnosis not present

## 2021-08-26 DIAGNOSIS — R252 Cramp and spasm: Secondary | ICD-10-CM | POA: Diagnosis not present

## 2021-08-27 DIAGNOSIS — E782 Mixed hyperlipidemia: Secondary | ICD-10-CM | POA: Diagnosis not present

## 2021-08-27 DIAGNOSIS — I739 Peripheral vascular disease, unspecified: Secondary | ICD-10-CM | POA: Diagnosis not present

## 2021-08-27 DIAGNOSIS — L918 Other hypertrophic disorders of the skin: Secondary | ICD-10-CM | POA: Diagnosis not present

## 2021-08-27 DIAGNOSIS — R002 Palpitations: Secondary | ICD-10-CM | POA: Diagnosis not present

## 2021-08-27 DIAGNOSIS — M79672 Pain in left foot: Secondary | ICD-10-CM | POA: Diagnosis not present

## 2021-08-27 DIAGNOSIS — Z8249 Family history of ischemic heart disease and other diseases of the circulatory system: Secondary | ICD-10-CM | POA: Diagnosis not present

## 2021-08-27 DIAGNOSIS — G894 Chronic pain syndrome: Secondary | ICD-10-CM | POA: Diagnosis not present

## 2021-08-27 DIAGNOSIS — E669 Obesity, unspecified: Secondary | ICD-10-CM | POA: Diagnosis not present

## 2021-08-27 DIAGNOSIS — R7301 Impaired fasting glucose: Secondary | ICD-10-CM | POA: Diagnosis not present

## 2021-08-28 ENCOUNTER — Other Ambulatory Visit: Payer: Self-pay

## 2021-08-28 ENCOUNTER — Ambulatory Visit (INDEPENDENT_AMBULATORY_CARE_PROVIDER_SITE_OTHER): Payer: Medicare HMO | Admitting: Cardiovascular Disease

## 2021-08-28 ENCOUNTER — Encounter: Payer: Self-pay | Admitting: Cardiovascular Disease

## 2021-08-28 VITALS — BP 158/80 | HR 55 | Ht 67.0 in | Wt 225.6 lb

## 2021-08-28 DIAGNOSIS — R002 Palpitations: Secondary | ICD-10-CM

## 2021-08-28 DIAGNOSIS — I517 Cardiomegaly: Secondary | ICD-10-CM

## 2021-08-28 DIAGNOSIS — E7801 Familial hypercholesterolemia: Secondary | ICD-10-CM | POA: Diagnosis not present

## 2021-08-28 NOTE — Patient Instructions (Addendum)
Testing/Procedures: Your physician has requested that you have an echocardiogram. Echocardiography is a painless test that uses sound waves to create images of your heart. It provides your doctor with information about the size and shape of your heart and how well your hearts chambers and valves are working. This procedure takes approximately one hour. There are no restrictions for this procedure.  Calcium Score- Out of pocket $99.00   Any Other Special Instructions Will Be Listed Below (If Applicable).  You have been referred to Dr. Debara Pickett.    If you need a refill on your cardiac medications before your next appointment, please call your pharmacy.

## 2021-09-01 ENCOUNTER — Encounter (HOSPITAL_COMMUNITY): Payer: Self-pay

## 2021-09-09 ENCOUNTER — Encounter: Payer: Self-pay | Admitting: Internal Medicine

## 2021-09-09 NOTE — Telephone Encounter (Signed)
error 

## 2021-09-11 ENCOUNTER — Telehealth (INDEPENDENT_AMBULATORY_CARE_PROVIDER_SITE_OTHER): Payer: Medicare HMO | Admitting: Internal Medicine

## 2021-09-11 ENCOUNTER — Other Ambulatory Visit: Payer: Self-pay

## 2021-09-11 ENCOUNTER — Encounter: Payer: Self-pay | Admitting: Internal Medicine

## 2021-09-11 VITALS — BP 127/72 | HR 63 | Ht 67.0 in | Wt 218.0 lb

## 2021-09-11 DIAGNOSIS — T466X5A Adverse effect of antihyperlipidemic and antiarteriosclerotic drugs, initial encounter: Secondary | ICD-10-CM

## 2021-09-11 DIAGNOSIS — M791 Myalgia, unspecified site: Secondary | ICD-10-CM | POA: Diagnosis not present

## 2021-09-11 DIAGNOSIS — E7801 Familial hypercholesterolemia: Secondary | ICD-10-CM

## 2021-09-11 NOTE — Patient Instructions (Signed)
Medication Instructions:  Dr. Debara Pickett recommends Repatha 140mg /mL or Praluent 150mg /mL (PCSK9). This is an injectable cholesterol medication self-administered once every 14 days. This medication will likely need prior approval with your insurance company, which we will work on. If the medication is not approved initially, we may need to do an appeal with your insurance.   Administer medication in area of fatty tissue such as abdomen, outer thigh, back of upper arm - and rotate site with each injection Store medication in refrigerator until ready to administer - allow to sit at room temp for 30 mins - 1 hour prior to injection Dispose of medication in a SHARPS container - your pharmacy should be able to direct you on this and proper disposal   If you need a co-pay card for Repatha: [email or web address]-[email or web address] >> paying for Repatha or red box that says "[place]" in top right If you need a co-pay card for Praluent: WedMap.it >> starting & paying for Praluent  Patient Assistance:   The PAN Foundation: https://www.panfoundation.org/disease-funds/hypercholesterolemia/ -- can sign up for wait list  The [employer] foundation offers assistance to help pay for medication copays.  They will cover copays for all cholesterol lowering meds, including statins, fibrates, omega-3 fish oils like Vascepa, ezetimibe, Repatha, Praluent, Nexletol, Nexlizet.  The cards are usually good for $2,500 or 12 months, whichever comes first. Go to healthwellfoundation.org Click on Apply Now Answer questions as to whom is applying (patient or representative) Your disease fund will be hypercholesterolemia - Medicare access They will ask questions about finances and which medications you are taking for cholesterol When you submit, the approval is usually within minutes.  You will need to print the card information from the site You will need to show this information to your pharmacy, they will bill your Medicare Part D plan  first -then bill [employer] Well --for the copay.   You can also call them at [phone], although the hold times can be quite long.     *If you need a refill on your cardiac medications before your next appointment, please call your pharmacy*   Lab Work: FASTING lab work in 3-4 months to check cholesterol  -- NMR lipoprofile, LP(a) -- complete about 1 week before your next visit with Dr. Debara Pickett   If you have labs (blood work) drawn today and your tests are completely normal, you will receive your results only by: [place] (if you have MyChart) OR A paper copy in the mail If you have any lab test that is abnormal or we need to change your treatment, we will call you to review the results.  Follow-Up: At [facility], you and your health needs are our priority.  As part of our continuing mission to provide you with exceptional heart care, we have created designated Provider Care Teams.  These Care Teams include your primary Cardiologist (physician) and Advanced Practice Providers (APPs -  Physician Assistants and Nurse Practitioners) who all work together to provide you with the care you need, when you need it.  We recommend signing up for the patient portal called "MyChart".  Sign up information is provided on this After Visit Summary.  MyChart is used to connect with patients for Virtual Visits (Telemedicine).  Patients are able to view lab/test results, encounter notes, upcoming appointments, etc.  Non-urgent messages can be sent to your provider as well.   To learn more about what you can do with MyChart, go to [email or web address].    Your next appointment:  3-4 months with Dr. Debara Pickett    Other Instructions

## 2021-09-11 NOTE — Progress Notes (Signed)
Virtual Visit via Video Note   This visit type was conducted due to national recommendations for restrictions regarding the COVID-19 Pandemic (e.g. social distancing) in an effort to limit this patient's exposure and mitigate transmission in our community.  Due to her co-morbid illnesses, this patient is at least at moderate risk for complications without adequate follow up.  This format is felt to be most appropriate for this patient at this time.  All issues noted in this document were discussed and addressed.  A limited physical exam was performed with this format.  Please refer to the patient's chart for her consent to telehealth for [facility].      Date:  09/11/2021   ID:  Stacey Atkinson, DOB 1945-11-11, MRN [id number] The patient was identified using 2 identifiers.  Evaluation Performed:  New Patient Evaluation  Patient Location:  [place] [address]  Provider location:   [address] [place], [place] [address]  PCP:  Celene Squibb, MD  Cardiologist:  None Electrophysiologist:  None   Chief Complaint:  Familial hyperlipidemia  History of Present Illness:    Stacey Atkinson is a 76 y.o. female who presents via audio/video conferencing for a telehealth visit today.  Stacey Atkinson is being seen today for the evaluation of familial hyperlipidemia at the request of Josue Hector, MD. this is a pleasant 76 year old female with a history of high cholesterol for many years.  She has tried numerous statins in the past and has been intolerant.  She recently was started on ezetimibe.  Lipids back in September showed total cholesterol 281, HDL 50, triglycerides 153 and LDL 203.  After starting on ezetimibe LDL came down to 177.  She does have family history of heart disease including her brother who died of heart failure and had high cholesterol in his 8s.  She was referred for an echocardiogram and a calcium score and then sent to me for evaluation of other  options including possible PCSK9 inhibitor therapy.  She says she is work to try to reduce cholesterol in her diet and has had various weights over her life span but has not noticed a change in her cholesterol, more suggestive of a familial hyperlipidemia.  The patient does not have symptoms concerning for COVID-19 infection (fever, chills, cough, or new SHORTNESS OF BREATH).    Prior CV studies:   The following studies were reviewed today:  Chart reviewed, lab work  PMHx:  Past Medical History:  Diagnosis Date   Angio-edema    Asthma    1990. no problems in years   Eczema    Glaucoma    HTN (hypertension)    Hyperlipidemia    Urticaria     Past Surgical History:  Procedure Laterality Date   CESAREAN SECTION  1974/1979   COLONOSCOPY  10/2012   Dr. Jackquline [place]: mild sigmoid diverticulosis, int/ext hemorrhoids. otherwise normal colon and terminal ileum. next tcs in 10 years,   EYE SURGERY Right 1963   right orbit rebuilt   TONSILLECTOMY      FAMHx:  Family History  Problem Relation Age of Onset   Allergic rhinitis Mother    Pancreatic cancer Mother    Asthma Sister    Inflammatory bowel disease Neg Hx    Colon cancer Neg Hx    Celiac disease Neg Hx     SOCHx:   reports that she has never smoked. She has never used smokeless tobacco. She reports current alcohol use.  She reports that she does not use drugs.  ALLERGIES:  Allergies  Allergen Reactions   Amoxicillin Hives and Swelling    Has patient had a PCN reaction causing immediate rash, facial/tongue/throat swelling, SOB or lightheadedness with hypotension: Unknown Has patient had a PCN reaction causing severe rash involving mucus membranes or skin necrosis: Unknown Has patient had a PCN reaction that required hospitalization: No Has patient had a PCN reaction occurring within the last 10 years: No If all of the above answers are "NO", then may proceed with Cephalosporin use.  Altered mental status    Clarithromycin     Unknown    Iohexol Hives, Itching and Swelling     tongue swelling   Levaquin [Levofloxacin In D5w] Other (See Comments)    unknown   Metaxalone Swelling    Tongue swelling   Other     Biorion   Oxaprozin     Unsure of reaction   Potassium Clavulanate [Clavulanic Acid]     Unsure of reaction   Trimethoprim     Unsure of reaction   Iodinated Contrast Media Hives, Itching and Swelling    Tongue swelling, patient denies  No breathing issues   Sulfa Antibiotics Rash    MEDS:  Current Meds  Medication Sig   b complex vitamins tablet Take 1 tablet by mouth daily. (when she remembers)   [employer] Oil 500 MG CAPS Take 1 capsule by mouth daily as needed (ALLERGIES).   calcium-vitamin D (OSCAL WITH D) 500-200 MG-UNIT tablet Take 1 tablet by mouth 2 (two) times daily. (when she remembers)   Cetirizine HCl 10 MG CAPS Take 10 mg by mouth daily as needed.   dorzolamide-timolol (COSOPT) 22.3-6.8 MG/ML ophthalmic solution Place 1 drop into both eyes 2 (two) times daily.    ezetimibe (ZETIA) 10 MG tablet Take 10 mg by mouth daily.   hydrochlorothiazide (MICROZIDE) 12.5 MG capsule Take 12.5 mg by mouth daily.   latanoprost (XALATAN) 0.005 % ophthalmic solution Place 1 drop into both eyes at bedtime.    meloxicam (MOBIC) 7.5 MG tablet Take 7.5 mg by mouth daily as needed for pain ((Gout)).    Oil of Oregano 1500 MG CAPS Take 1 capsule by mouth daily as needed (ALLERGIES).   olmesartan (BENICAR) 40 MG tablet Take 40 mg by mouth daily.   Omega 3-6-9 Fatty Acids (OMEGA-3-6-9 PO) Take 1 capsule by mouth daily.   pantoprazole (PROTONIX) 40 MG tablet Take 40 mg by mouth daily.   Probiotic CAPS Take 1 capsule by mouth daily. (when she remembers)   tiZANidine (ZANAFLEX) 4 MG tablet Take 4 mg by mouth 2 (two) times daily.   vitamin C (ASCORBIC ACID) 250 MG tablet Take 250 mg by mouth daily. (not all the time)   zinc gluconate 50 MG tablet Take 50 mg by mouth daily. (not all  the time)     ROS: Pertinent items noted in HPI and remainder of comprehensive ROS otherwise negative.  Labs/Other Tests and Data Reviewed:    Recent Labs: No results found for requested labs within last 8760 hours.   Recent Lipid Panel No results found for: CHOL, TRIG, HDL, CHOLHDL, LDLCALC, LDLDIRECT  Wt Readings from Last 3 Encounters:  09/11/21 218 lb (98.9 kg)  08/28/21 225 lb 9.6 oz (102.3 kg)  08/18/18 218 lb (98.9 kg)     Exam:    Vital Signs:  BP 127/72    Pulse 63    Ht 5\' 7"  (1.702 m)  Wt 218 lb (98.9 kg)    BMI 34.14 kg/m    General appearance: alert, no distress, and mildly obese Lungs: No visual respiratory difficulty Abdomen: Obese Extremities: extremities normal, atraumatic, no cyanosis or edema Skin: Skin color, texture, turgor normal. No rashes or lesions Neurologic: Grossly normal Psych: Plus  ASSESSMENT & PLAN:    Probable familial hyperlipidemia, LDL greater than 190 untreated Family history of premature coronary disease in her brother who had high cholesterol Statin intolerant-myalgias  Ms. Chandonnet has a probable familial hyperlipidemia with LDL greater than 190, given her history of early onset heart disease in her brother who died of heart failure and had high cholesterol in his 47s, her likelihood of FH by Baruch Merl criteria is probable.  Based on this and the fact that she cannot take statins due to myalgias, having tried more than 3 in the past, she is a good candidate for PCSK9 inhibitor.  In addition PCSK9 inhibitors carry primary indications for lipid-lowering in patients with familial hyperlipidemia irrespective of statin therapy.  I would recommend pursuing this therapy for her.  She has an echocardiogram and calcium score which have been ordered as well and we are awaiting those results.  Plan repeat lipid including an NMR and LP(a) in about 3 months and follow-up with me at that time.  Thanks again for the kind referral.  COVID-19  Education: The signs and symptoms of COVID-19 were discussed with the patient and how to seek care for testing (follow up with PCP or arrange E-visit).  The importance of social distancing was discussed today.  Patient Risk:   After full review of this patients clinical status, I feel that they are at least moderate risk at this time.  Time:   Today, I have spent 25 minutes with the patient with telehealth technology discussing dyslipidemia.     Medication Adjustments/Labs and Tests Ordered: Current medicines are reviewed at length with the patient today.  Concerns regarding medicines are outlined above.   Tests Ordered: Orders Placed This Encounter  Procedures   NMR, lipoprofile   Lipoprotein A (LPA)    Medication Changes: No orders of the defined types were placed in this encounter.   Disposition:  in 3 month(s)  Pixie Casino, MD, [facility], [place] Director of the Advanced Lipid Disorders &  Cardiovascular Risk Reduction Clinic Diplomate of the American Board of Clinical Lipidology Attending Cardiologist  Direct Dial: [phone]   Fax: [phone]  Website:  [email or web address]  Pixie Casino, MD  09/11/2021 3:06 PM

## 2021-09-16 DIAGNOSIS — E119 Type 2 diabetes mellitus without complications: Secondary | ICD-10-CM | POA: Diagnosis not present

## 2021-09-16 DIAGNOSIS — I10 Essential (primary) hypertension: Secondary | ICD-10-CM | POA: Diagnosis not present

## 2021-09-17 MED ORDER — REPATHA SURECLICK 140 MG/ML ~~LOC~~ SOAJ: 1.0000 | SUBCUTANEOUS | 11 refills | Status: DC

## 2021-09-17 NOTE — Telephone Encounter (Signed)
Left message to call back OR respond to MyChart message about PCKS9i, need to arrange follow up ?

## 2021-09-18 ENCOUNTER — Telehealth: Payer: Self-pay | Admitting: Internal Medicine

## 2021-09-18 NOTE — Telephone Encounter (Signed)
Request Reference Number: [id number]. REPATHA SURE INJ 140MG /ML is approved through 03/21/2022.  ?

## 2021-09-18 NOTE — Telephone Encounter (Signed)
PA for repatha sureclick submitted via CMM ?(Key: [id number]) ?

## 2021-09-19 ENCOUNTER — Telehealth: Payer: Self-pay | Admitting: Internal Medicine

## 2021-09-19 NOTE — Telephone Encounter (Signed)
Patient is approved for hypercholesterolemia-medicare access grant from [employer] ID: [id number]  Pharmacy card ID: [id number] PC Group: [id number] PC BIN: [id number] PC PCN: [id number] Dates 08/19/21 - 08/18/22

## 2021-09-22 ENCOUNTER — Ambulatory Visit (HOSPITAL_COMMUNITY)
Admission: RE | Admit: 2021-09-22 | Discharge: 2021-09-22 | Disposition: A | Discharge status: Home / Self Care | Payer: Medicare Other | Source: Ambulatory Visit | Attending: Cardiovascular Disease | Admitting: Cardiovascular Disease

## 2021-09-22 DIAGNOSIS — I517 Cardiomegaly: Secondary | ICD-10-CM | POA: Insufficient documentation

## 2021-09-22 LAB — ECHOCARDIOGRAM COMPLETE
AR max vel: 2.23 cm2
AV Area VTI: 2.52 cm2
AV Area mean vel: 2.32 cm2
AV Mean grad: 5 mmHg
AV Peak grad: 14 mmHg
Ao pk vel: 1.87 m/s
Area-P 1/2: 2.46 cm2
Calc EF: 71 %
MV VTI: 2.85 cm2
S' Lateral: 2.5 cm
Single Plane A2C EF: 75.8 %
Single Plane A4C EF: 65.6 %

## 2021-09-22 NOTE — Progress Notes (Signed)
*  PRELIMINARY RESULTS* ?Echocardiogram ?2D Echocardiogram has been performed. ? ?Elpidio Anis ?09/22/2021, 4:09 PM ?

## 2021-09-26 ENCOUNTER — Ambulatory Visit (HOSPITAL_COMMUNITY): Admission: RE | Admit: 2021-09-26 | Payer: Medicare HMO | Source: Ambulatory Visit

## 2021-09-26 ENCOUNTER — Encounter (HOSPITAL_COMMUNITY): Payer: Self-pay

## 2021-09-29 NOTE — Telephone Encounter (Signed)
Ok .. if she does not want injections, the only other FDA approved option is Nexizet if she is interested. ? ?Dr. Lemmie Evens ?

## 2021-09-30 DIAGNOSIS — I83892 Varicose veins of left lower extremities with other complications: Secondary | ICD-10-CM | POA: Diagnosis not present

## 2021-10-02 DIAGNOSIS — Z09 Encounter for follow-up examination after completed treatment for conditions other than malignant neoplasm: Secondary | ICD-10-CM | POA: Diagnosis not present

## 2021-10-02 DIAGNOSIS — I83812 Varicose veins of left lower extremities with pain: Secondary | ICD-10-CM | POA: Diagnosis not present

## 2021-10-06 MED ORDER — NEXLIZET 180-10 MG PO TABS: 1.0000 | ORAL_TABLET | Freq: Every day | ORAL | 5 refills | Status: DC

## 2021-10-06 NOTE — Addendum Note (Signed)
Addended by: Fidel Levy on: 10/06/2021 07:38 AM ? ? Modules accepted: Orders ? ?

## 2021-10-10 NOTE — Telephone Encounter (Signed)
I would say follow-up PRN if she is interested in trying other therapies beyond the holistic route (however, she did not want to try the injection - and could not tolerate Nexletol, so the options are limited). ? ?Dr. Lemmie Evens ?

## 2021-10-10 NOTE — Addendum Note (Signed)
Addended by: Fidel Levy on: 10/10/2021 08:32 AM ? ? Modules accepted: Orders ? ?

## 2021-10-14 DIAGNOSIS — I83892 Varicose veins of left lower extremities with other complications: Secondary | ICD-10-CM | POA: Diagnosis not present

## 2021-10-17 DIAGNOSIS — E119 Type 2 diabetes mellitus without complications: Secondary | ICD-10-CM | POA: Diagnosis not present

## 2021-10-17 DIAGNOSIS — I10 Essential (primary) hypertension: Secondary | ICD-10-CM | POA: Diagnosis not present

## 2021-10-28 DIAGNOSIS — I83812 Varicose veins of left lower extremities with pain: Secondary | ICD-10-CM | POA: Diagnosis not present

## 2021-10-28 DIAGNOSIS — I83892 Varicose veins of left lower extremities with other complications: Secondary | ICD-10-CM | POA: Diagnosis not present

## 2021-10-28 DIAGNOSIS — M7989 Other specified soft tissue disorders: Secondary | ICD-10-CM | POA: Diagnosis not present

## 2021-11-16 DIAGNOSIS — E782 Mixed hyperlipidemia: Secondary | ICD-10-CM | POA: Diagnosis not present

## 2021-11-16 DIAGNOSIS — M797 Fibromyalgia: Secondary | ICD-10-CM | POA: Diagnosis not present

## 2021-11-16 DIAGNOSIS — I10 Essential (primary) hypertension: Secondary | ICD-10-CM | POA: Diagnosis not present

## 2021-11-24 ENCOUNTER — Other Ambulatory Visit (HOSPITAL_COMMUNITY): Payer: Self-pay | Admitting: Family Medicine

## 2021-11-24 ENCOUNTER — Ambulatory Visit (HOSPITAL_COMMUNITY)
Admission: RE | Admit: 2021-11-24 | Discharge: 2021-11-24 | Disposition: A | Discharge status: Home / Self Care | Payer: Medicare Other | Source: Ambulatory Visit | Attending: Family Medicine | Admitting: Family Medicine

## 2021-11-24 DIAGNOSIS — M25561 Pain in right knee: Secondary | ICD-10-CM | POA: Diagnosis not present

## 2021-11-24 DIAGNOSIS — G894 Chronic pain syndrome: Secondary | ICD-10-CM | POA: Diagnosis not present

## 2021-11-24 DIAGNOSIS — E785 Hyperlipidemia, unspecified: Secondary | ICD-10-CM | POA: Diagnosis not present

## 2021-12-17 DIAGNOSIS — M797 Fibromyalgia: Secondary | ICD-10-CM | POA: Diagnosis not present

## 2021-12-17 DIAGNOSIS — E782 Mixed hyperlipidemia: Secondary | ICD-10-CM | POA: Diagnosis not present

## 2021-12-17 DIAGNOSIS — I10 Essential (primary) hypertension: Secondary | ICD-10-CM | POA: Diagnosis not present

## 2022-01-07 ENCOUNTER — Telehealth: Payer: Medicare Other | Admitting: Internal Medicine

## 2022-01-07 DIAGNOSIS — I87393 Chronic venous hypertension (idiopathic) with other complications of bilateral lower extremity: Secondary | ICD-10-CM | POA: Diagnosis not present

## 2022-01-15 DIAGNOSIS — M79661 Pain in right lower leg: Secondary | ICD-10-CM | POA: Diagnosis not present

## 2022-01-15 DIAGNOSIS — R600 Localized edema: Secondary | ICD-10-CM | POA: Diagnosis not present

## 2022-01-15 DIAGNOSIS — M79604 Pain in right leg: Secondary | ICD-10-CM | POA: Diagnosis not present

## 2022-01-15 DIAGNOSIS — R252 Cramp and spasm: Secondary | ICD-10-CM | POA: Diagnosis not present

## 2022-02-03 DIAGNOSIS — Z1231 Encounter for screening mammogram for malignant neoplasm of breast: Secondary | ICD-10-CM | POA: Diagnosis not present

## 2022-02-16 DIAGNOSIS — I10 Essential (primary) hypertension: Secondary | ICD-10-CM | POA: Diagnosis not present

## 2022-02-16 DIAGNOSIS — E782 Mixed hyperlipidemia: Secondary | ICD-10-CM | POA: Diagnosis not present

## 2022-02-16 DIAGNOSIS — M797 Fibromyalgia: Secondary | ICD-10-CM | POA: Diagnosis not present

## 2022-02-26 DIAGNOSIS — R7301 Impaired fasting glucose: Secondary | ICD-10-CM | POA: Diagnosis not present

## 2022-02-26 DIAGNOSIS — Z8249 Family history of ischemic heart disease and other diseases of the circulatory system: Secondary | ICD-10-CM | POA: Diagnosis not present

## 2022-02-26 DIAGNOSIS — R002 Palpitations: Secondary | ICD-10-CM | POA: Diagnosis not present

## 2022-02-26 DIAGNOSIS — E782 Mixed hyperlipidemia: Secondary | ICD-10-CM | POA: Diagnosis not present

## 2022-02-26 DIAGNOSIS — I10 Essential (primary) hypertension: Secondary | ICD-10-CM | POA: Diagnosis not present

## 2022-02-26 DIAGNOSIS — M79672 Pain in left foot: Secondary | ICD-10-CM | POA: Diagnosis not present

## 2022-02-26 DIAGNOSIS — G894 Chronic pain syndrome: Secondary | ICD-10-CM | POA: Diagnosis not present

## 2022-02-26 DIAGNOSIS — I739 Peripheral vascular disease, unspecified: Secondary | ICD-10-CM | POA: Diagnosis not present

## 2022-02-26 DIAGNOSIS — E669 Obesity, unspecified: Secondary | ICD-10-CM | POA: Diagnosis not present

## 2022-03-26 DIAGNOSIS — Z6836 Body mass index (BMI) 36.0-36.9, adult: Secondary | ICD-10-CM | POA: Diagnosis not present

## 2022-03-26 DIAGNOSIS — M791 Myalgia, unspecified site: Secondary | ICD-10-CM | POA: Diagnosis not present

## 2022-03-26 DIAGNOSIS — I10 Essential (primary) hypertension: Secondary | ICD-10-CM | POA: Diagnosis not present

## 2022-03-26 DIAGNOSIS — I739 Peripheral vascular disease, unspecified: Secondary | ICD-10-CM | POA: Diagnosis not present

## 2022-03-26 DIAGNOSIS — M25561 Pain in right knee: Secondary | ICD-10-CM | POA: Diagnosis not present

## 2022-03-26 DIAGNOSIS — R1012 Left upper quadrant pain: Secondary | ICD-10-CM | POA: Diagnosis not present

## 2022-03-26 DIAGNOSIS — E782 Mixed hyperlipidemia: Secondary | ICD-10-CM | POA: Diagnosis not present

## 2022-03-26 DIAGNOSIS — G894 Chronic pain syndrome: Secondary | ICD-10-CM | POA: Diagnosis not present

## 2022-03-26 DIAGNOSIS — E6609 Other obesity due to excess calories: Secondary | ICD-10-CM | POA: Diagnosis not present

## 2022-04-17 ENCOUNTER — Other Ambulatory Visit (HOSPITAL_COMMUNITY): Payer: Self-pay | Admitting: Internal Medicine

## 2022-04-17 DIAGNOSIS — Z13820 Encounter for screening for osteoporosis: Secondary | ICD-10-CM | POA: Diagnosis not present

## 2022-04-17 DIAGNOSIS — I10 Essential (primary) hypertension: Secondary | ICD-10-CM | POA: Diagnosis not present

## 2022-04-17 DIAGNOSIS — M797 Fibromyalgia: Secondary | ICD-10-CM | POA: Diagnosis not present

## 2022-04-17 DIAGNOSIS — Z23 Encounter for immunization: Secondary | ICD-10-CM | POA: Diagnosis not present

## 2022-04-17 DIAGNOSIS — Z136 Encounter for screening for cardiovascular disorders: Secondary | ICD-10-CM | POA: Diagnosis not present

## 2022-04-17 DIAGNOSIS — Z0000 Encounter for general adult medical examination without abnormal findings: Secondary | ICD-10-CM | POA: Diagnosis not present

## 2022-04-17 DIAGNOSIS — Z1231 Encounter for screening mammogram for malignant neoplasm of breast: Secondary | ICD-10-CM

## 2022-04-24 ENCOUNTER — Ambulatory Visit (HOSPITAL_COMMUNITY)
Admission: RE | Admit: 2022-04-24 | Discharge: 2022-04-24 | Disposition: A | Discharge status: Home / Self Care | Payer: Medicare HMO | Source: Ambulatory Visit | Attending: Internal Medicine | Admitting: Internal Medicine

## 2022-04-24 DIAGNOSIS — Z780 Asymptomatic menopausal state: Secondary | ICD-10-CM | POA: Insufficient documentation

## 2022-04-24 DIAGNOSIS — Z13820 Encounter for screening for osteoporosis: Secondary | ICD-10-CM | POA: Diagnosis not present

## 2022-07-30 DIAGNOSIS — T781XXA Other adverse food reactions, not elsewhere classified, initial encounter: Secondary | ICD-10-CM | POA: Diagnosis not present

## 2022-07-30 DIAGNOSIS — E782 Mixed hyperlipidemia: Secondary | ICD-10-CM | POA: Diagnosis not present

## 2022-07-30 DIAGNOSIS — Z0001 Encounter for general adult medical examination with abnormal findings: Secondary | ICD-10-CM | POA: Diagnosis not present

## 2022-07-30 DIAGNOSIS — E870 Hyperosmolality and hypernatremia: Secondary | ICD-10-CM | POA: Diagnosis not present

## 2022-07-30 DIAGNOSIS — B009 Herpesviral infection, unspecified: Secondary | ICD-10-CM | POA: Diagnosis not present

## 2022-07-30 DIAGNOSIS — M791 Myalgia, unspecified site: Secondary | ICD-10-CM | POA: Diagnosis not present

## 2022-07-30 DIAGNOSIS — I10 Essential (primary) hypertension: Secondary | ICD-10-CM | POA: Diagnosis not present

## 2022-07-30 DIAGNOSIS — R7303 Prediabetes: Secondary | ICD-10-CM | POA: Diagnosis not present

## 2022-07-30 DIAGNOSIS — G720 Drug-induced myopathy: Secondary | ICD-10-CM | POA: Diagnosis not present

## 2022-08-06 ENCOUNTER — Encounter: Payer: Self-pay | Admitting: *Deleted

## 2022-09-10 DIAGNOSIS — Z1211 Encounter for screening for malignant neoplasm of colon: Secondary | ICD-10-CM | POA: Diagnosis not present

## 2022-09-23 DIAGNOSIS — H40053 Ocular hypertension, bilateral: Secondary | ICD-10-CM | POA: Diagnosis not present

## 2022-09-28 DIAGNOSIS — H9209 Otalgia, unspecified ear: Secondary | ICD-10-CM | POA: Diagnosis not present

## 2022-09-28 DIAGNOSIS — M79651 Pain in right thigh: Secondary | ICD-10-CM | POA: Diagnosis not present

## 2022-09-28 DIAGNOSIS — H9202 Otalgia, left ear: Secondary | ICD-10-CM | POA: Diagnosis not present

## 2022-09-28 DIAGNOSIS — M79609 Pain in unspecified limb: Secondary | ICD-10-CM | POA: Diagnosis not present

## 2022-09-28 DIAGNOSIS — M5431 Sciatica, right side: Secondary | ICD-10-CM | POA: Diagnosis not present

## 2022-10-07 DIAGNOSIS — M9902 Segmental and somatic dysfunction of thoracic region: Secondary | ICD-10-CM | POA: Diagnosis not present

## 2022-10-07 DIAGNOSIS — M546 Pain in thoracic spine: Secondary | ICD-10-CM | POA: Diagnosis not present

## 2022-10-07 DIAGNOSIS — M9901 Segmental and somatic dysfunction of cervical region: Secondary | ICD-10-CM | POA: Diagnosis not present

## 2022-10-07 DIAGNOSIS — M542 Cervicalgia: Secondary | ICD-10-CM | POA: Diagnosis not present

## 2022-10-07 DIAGNOSIS — M9903 Segmental and somatic dysfunction of lumbar region: Secondary | ICD-10-CM | POA: Diagnosis not present

## 2022-10-07 DIAGNOSIS — M5441 Lumbago with sciatica, right side: Secondary | ICD-10-CM | POA: Diagnosis not present

## 2023-01-19 ENCOUNTER — Encounter: Payer: Self-pay | Admitting: *Deleted

## 2023-01-27 DIAGNOSIS — R7303 Prediabetes: Secondary | ICD-10-CM | POA: Diagnosis not present

## 2023-01-27 DIAGNOSIS — E782 Mixed hyperlipidemia: Secondary | ICD-10-CM | POA: Diagnosis not present

## 2023-02-05 DIAGNOSIS — I10 Essential (primary) hypertension: Secondary | ICD-10-CM | POA: Diagnosis not present

## 2023-02-05 DIAGNOSIS — E6609 Other obesity due to excess calories: Secondary | ICD-10-CM | POA: Diagnosis not present

## 2023-02-05 DIAGNOSIS — E782 Mixed hyperlipidemia: Secondary | ICD-10-CM | POA: Diagnosis not present

## 2023-02-05 DIAGNOSIS — M791 Myalgia, unspecified site: Secondary | ICD-10-CM | POA: Diagnosis not present

## 2023-02-05 DIAGNOSIS — I739 Peripheral vascular disease, unspecified: Secondary | ICD-10-CM | POA: Diagnosis not present

## 2023-02-05 DIAGNOSIS — Z8249 Family history of ischemic heart disease and other diseases of the circulatory system: Secondary | ICD-10-CM | POA: Diagnosis not present

## 2023-02-05 DIAGNOSIS — G894 Chronic pain syndrome: Secondary | ICD-10-CM | POA: Diagnosis not present

## 2023-02-05 DIAGNOSIS — E669 Obesity, unspecified: Secondary | ICD-10-CM | POA: Diagnosis not present

## 2023-02-05 DIAGNOSIS — R7303 Prediabetes: Secondary | ICD-10-CM | POA: Diagnosis not present

## 2023-04-30 ENCOUNTER — Telehealth: Payer: Self-pay | Admitting: Cardiovascular Disease

## 2023-04-30 NOTE — Telephone Encounter (Signed)
Calling requesting a copy of patient last echo. Fax #[phone]. Please advise
